# Patient Record
Sex: Male | Born: 1955 | Race: Black or African American | Hispanic: No | Smoking: Never smoker
Health system: Southern US, Community
[De-identification: ages and names within clinical notes are randomized; demographics above are authoritative.]

## PROBLEM LIST (undated history)

## (undated) DIAGNOSIS — E785 Hyperlipidemia, unspecified: Secondary | ICD-10-CM

## (undated) DIAGNOSIS — J45909 Unspecified asthma, uncomplicated: Secondary | ICD-10-CM

## (undated) DIAGNOSIS — I10 Essential (primary) hypertension: Secondary | ICD-10-CM

## (undated) DIAGNOSIS — K219 Gastro-esophageal reflux disease without esophagitis: Secondary | ICD-10-CM

## (undated) DIAGNOSIS — D649 Anemia, unspecified: Secondary | ICD-10-CM

## (undated) DIAGNOSIS — N189 Chronic kidney disease, unspecified: Secondary | ICD-10-CM

## (undated) HISTORY — DX: Hyperlipidemia, unspecified: E78.5

## (undated) HISTORY — PX: CYST REMOVAL TRUNK: SHX6283

## (undated) HISTORY — DX: Essential (primary) hypertension: I10

---

## 2001-05-10 ENCOUNTER — Encounter: Payer: Self-pay | Admitting: *Deleted

## 2001-05-10 ENCOUNTER — Encounter: Admission: RE | Admit: 2001-05-10 | Discharge: 2001-05-10 | Payer: Self-pay | Admitting: *Deleted

## 2002-03-26 ENCOUNTER — Ambulatory Visit: Admission: RE | Admit: 2002-03-26 | Discharge: 2002-03-26 | Payer: Self-pay | Admitting: Nephrology

## 2002-03-29 ENCOUNTER — Observation Stay (HOSPITAL_COMMUNITY): Admission: RE | Admit: 2002-03-29 | Discharge: 2002-03-30 | Payer: Self-pay | Admitting: Nephrology

## 2002-03-29 ENCOUNTER — Encounter: Payer: Self-pay | Admitting: Nephrology

## 2002-05-28 ENCOUNTER — Emergency Department (HOSPITAL_COMMUNITY): Admission: EM | Admit: 2002-05-28 | Discharge: 2002-05-29 | Payer: Self-pay | Admitting: *Deleted

## 2004-08-24 ENCOUNTER — Encounter: Admission: RE | Admit: 2004-08-24 | Discharge: 2004-11-22 | Payer: Self-pay | Admitting: Internal Medicine

## 2006-07-16 ENCOUNTER — Emergency Department (HOSPITAL_COMMUNITY): Admission: EM | Admit: 2006-07-16 | Discharge: 2006-07-16 | Payer: Self-pay | Admitting: Emergency Medicine

## 2010-12-27 ENCOUNTER — Inpatient Hospital Stay (HOSPITAL_COMMUNITY): Payer: PRIVATE HEALTH INSURANCE

## 2010-12-27 ENCOUNTER — Inpatient Hospital Stay (HOSPITAL_COMMUNITY)
Admission: AD | Admit: 2010-12-27 | Discharge: 2010-12-30 | DRG: 641 | Disposition: A | Discharge status: Home / Self Care | Payer: PRIVATE HEALTH INSURANCE | Source: Ambulatory Visit | Attending: Internal Medicine | Admitting: Internal Medicine

## 2010-12-27 DIAGNOSIS — R51 Headache: Secondary | ICD-10-CM | POA: Diagnosis present

## 2010-12-27 DIAGNOSIS — N032 Chronic nephritic syndrome with diffuse membranous glomerulonephritis: Secondary | ICD-10-CM | POA: Diagnosis present

## 2010-12-27 DIAGNOSIS — E785 Hyperlipidemia, unspecified: Secondary | ICD-10-CM | POA: Diagnosis present

## 2010-12-27 DIAGNOSIS — I129 Hypertensive chronic kidney disease with stage 1 through stage 4 chronic kidney disease, or unspecified chronic kidney disease: Secondary | ICD-10-CM | POA: Diagnosis present

## 2010-12-27 DIAGNOSIS — E860 Dehydration: Principal | ICD-10-CM | POA: Diagnosis present

## 2010-12-27 DIAGNOSIS — K5289 Other specified noninfective gastroenteritis and colitis: Secondary | ICD-10-CM | POA: Diagnosis present

## 2010-12-27 DIAGNOSIS — N183 Chronic kidney disease, stage 3 unspecified: Secondary | ICD-10-CM | POA: Diagnosis present

## 2010-12-27 DIAGNOSIS — N179 Acute kidney failure, unspecified: Secondary | ICD-10-CM | POA: Diagnosis present

## 2010-12-27 LAB — COMPREHENSIVE METABOLIC PANEL
ALT: 18 U/L (ref 0–53)
AST: 27 U/L (ref 0–37)
Albumin: 3.3 g/dL — ABNORMAL LOW (ref 3.5–5.2)
Alkaline Phosphatase: 69 U/L (ref 39–117)
BUN: 52 mg/dL — ABNORMAL HIGH (ref 6–23)
CO2: 26 mEq/L (ref 19–32)
Calcium: 9.7 mg/dL (ref 8.4–10.5)
Chloride: 99 mEq/L (ref 96–112)
Creatinine, Ser: 4.02 mg/dL — ABNORMAL HIGH (ref 0.50–1.35)
GFR calc Af Amer: 19 mL/min — ABNORMAL LOW (ref >60)
GFR calc non Af Amer: 16 mL/min — ABNORMAL LOW (ref >60)
Glucose, Bld: 167 mg/dL — ABNORMAL HIGH (ref 70–99)
Potassium: 3.6 mEq/L (ref 3.5–5.1)
Sodium: 137 mEq/L (ref 135–145)
Total Bilirubin: 0.6 mg/dL (ref 0.3–1.2)
Total Protein: 7.7 g/dL (ref 6.0–8.3)

## 2010-12-27 LAB — CBC
HCT: 41.4 % (ref 39.0–52.0)
Hemoglobin: 14.9 g/dL (ref 13.0–17.0)
MCH: 30.5 pg (ref 26.0–34.0)
MCHC: 36 g/dL (ref 30.0–36.0)
MCV: 84.7 fL (ref 78.0–100.0)
Platelets: 138 10*3/uL — ABNORMAL LOW (ref 150–400)
RBC: 4.89 MIL/uL (ref 4.22–5.81)
RDW: 14.5 % (ref 11.5–15.5)
WBC: 20.8 10*3/uL — ABNORMAL HIGH (ref 4.0–10.5)

## 2010-12-27 IMAGING — CT CT ABD-PELV W/O CM
2 of 4 series · 17 of 46 positions shown, 19 images · non-contrast
Comparison: None.

CLINICAL DATA: Abdominal pain with nausea and vomiting since
yesterday.

CT ABDOMEN AND PELVIS WITHOUT CONTRAST
TECHNIQUE: Multidetector CT imaging of the abdomen and pelvis was
performed following the standard protocol without intravenous
contrast.

[Series 2: abd/pelv w/o 5.0 b31f st · axial · non-contrast · 0.83mm/px · z∈[+934,+1410]mm · 14 of 105 slices shown, 16 images]
[im 5/105  soft-tissue]
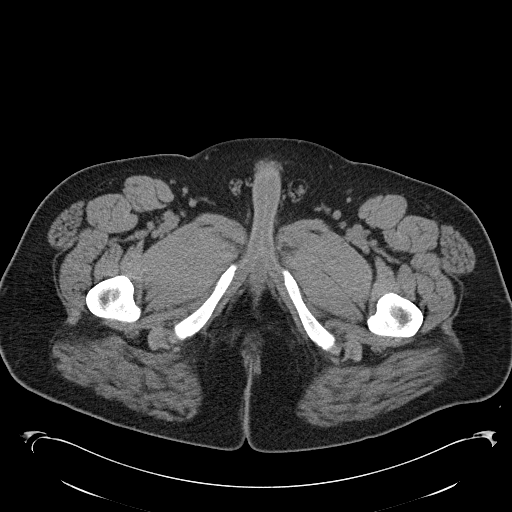
[im 5/105  bone]
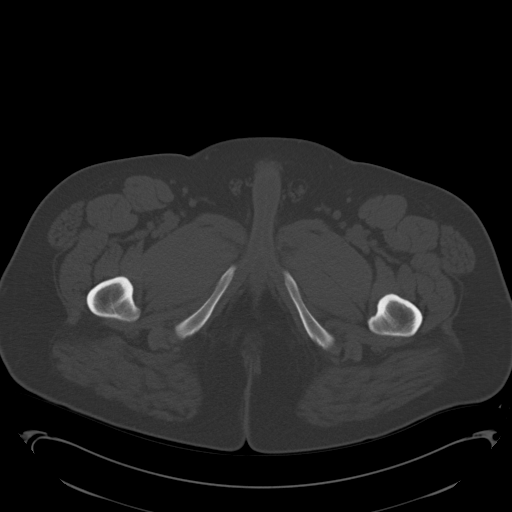
[im 13/105  soft-tissue]
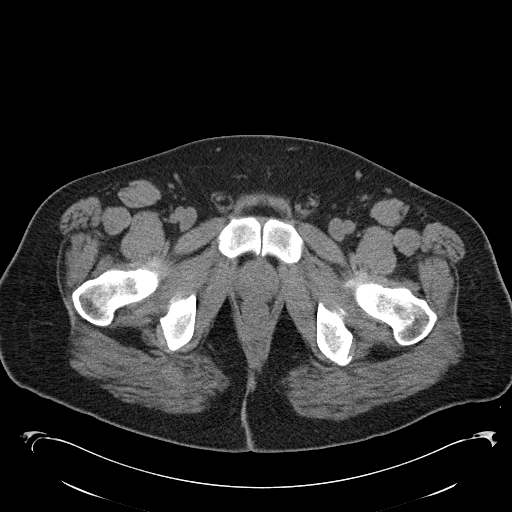
[im 21/105  soft-tissue]
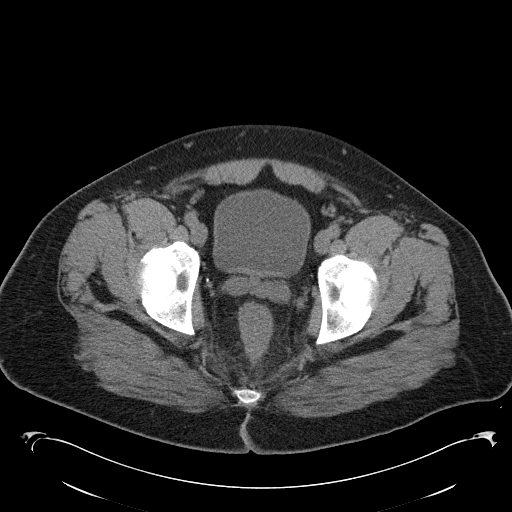
[im 30/105  soft-tissue]
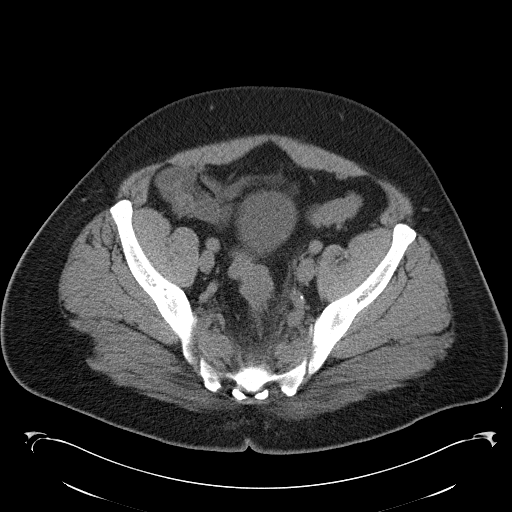
[im 34/105  soft-tissue]
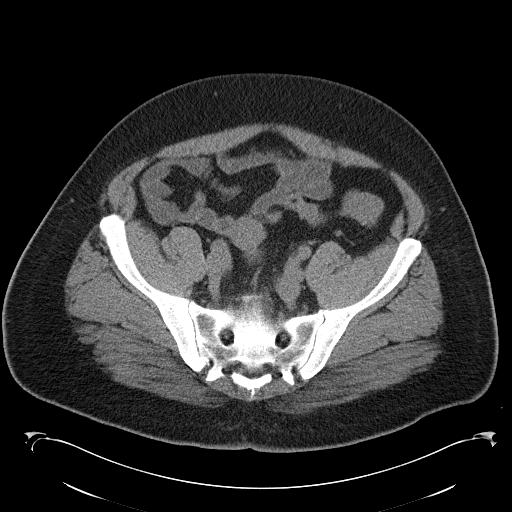
[im 42/105  soft-tissue]
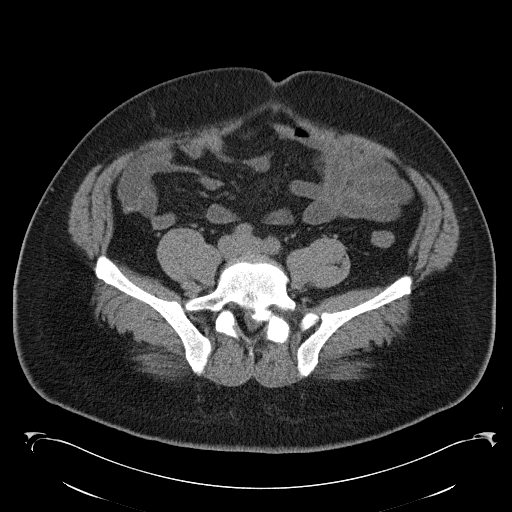
[im 50/105  soft-tissue]
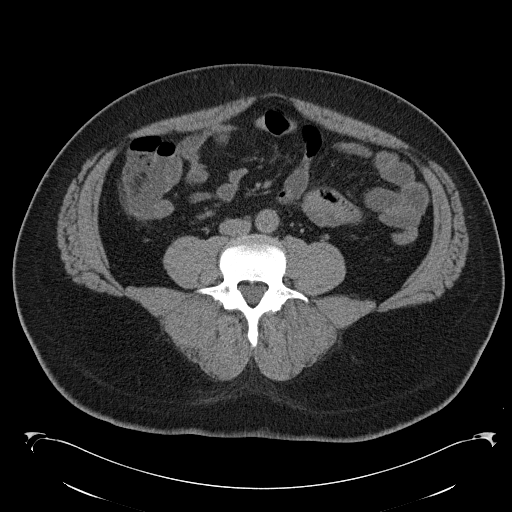
[im 55/105  soft-tissue]
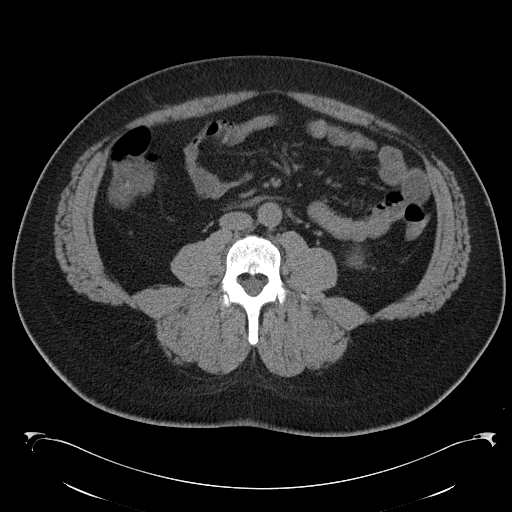
[im 63/105  soft-tissue]
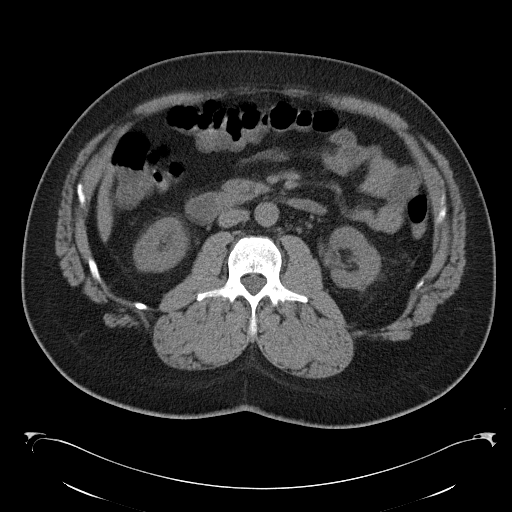
[im 63/105  bone]
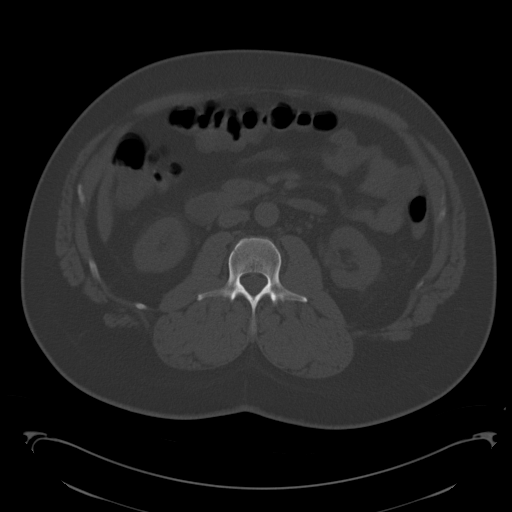
[im 71/105  soft-tissue]
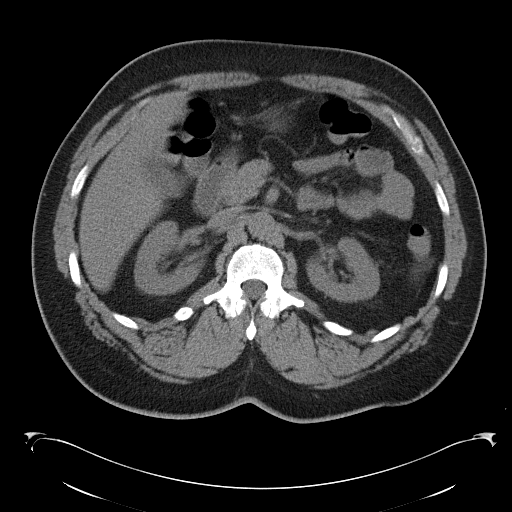
[im 80/105  soft-tissue]
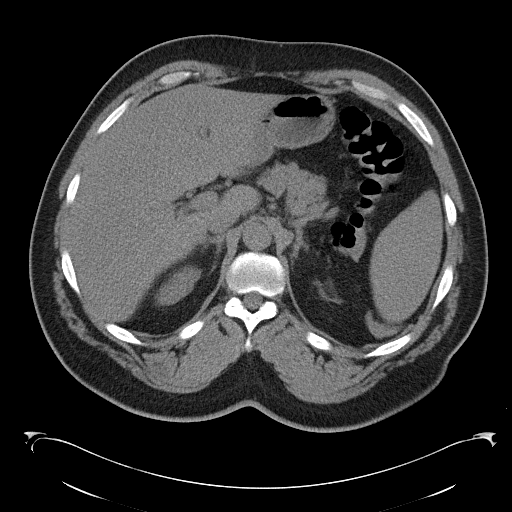
[im 84/105  soft-tissue]
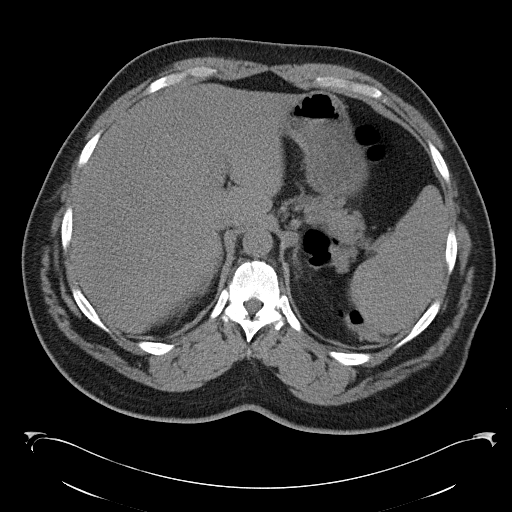
[im 92/105  soft-tissue]
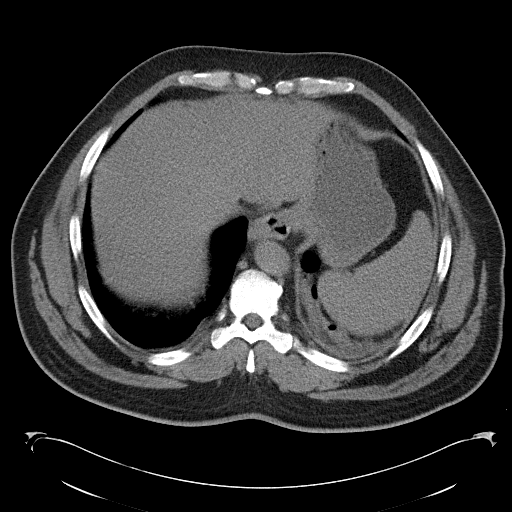
[im 100/105  soft-tissue]
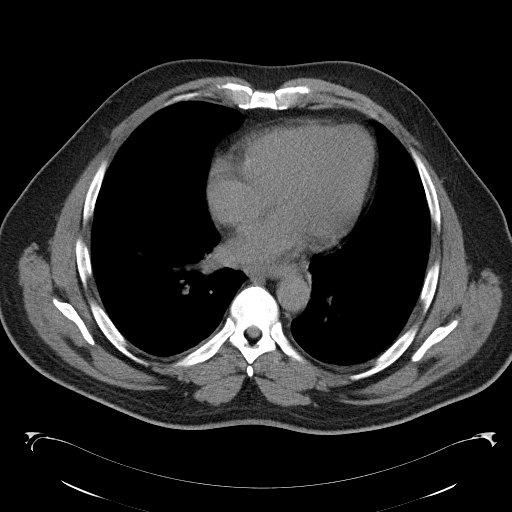

[Series 602: cor · coronal · 1.02mm/px · 3 of 161 slices shown]
[im 54/161  soft-tissue]
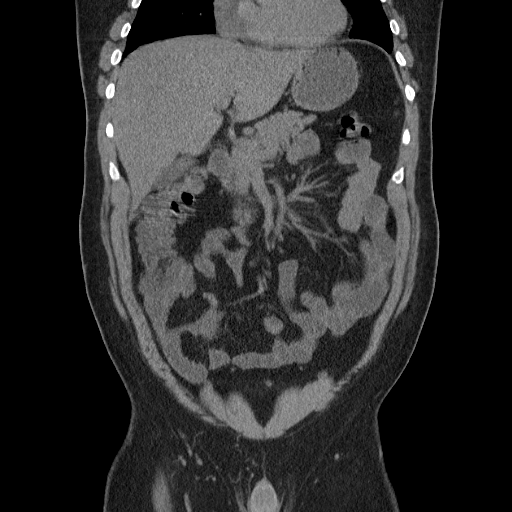
[im 72/161  soft-tissue]
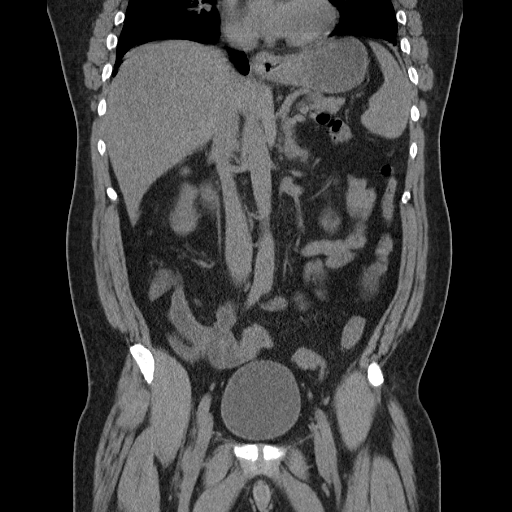
[im 89/161  soft-tissue]
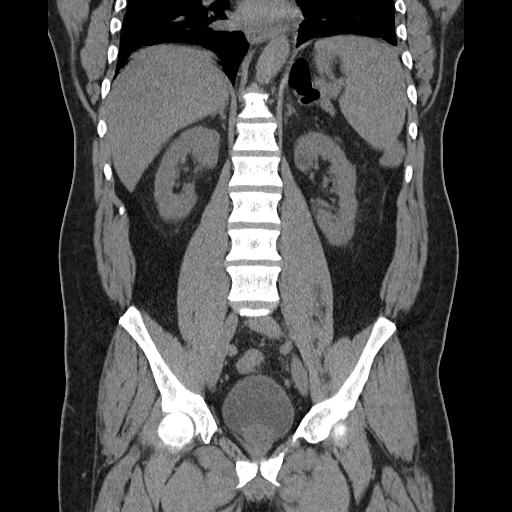

[17 of 46 positions shown; findings below may reference images not displayed]

FINDINGS: There is a small amount of pleural fluid on the left with
mild adjacent left lower lobe atelectasis.  The lung bases are
otherwise clear.  There is a small hiatal hernia.

The liver demonstrates diffusely decreased density consistent with
steatosis.  No focal lesions are identified within the liver,
spleen, gallbladder, pancreas or adrenal glands on noncontrast
imaging.  There are several low density renal lesions bilaterally
which are suboptimally evaluated without contrast, although likely
represent cysts.  There is no hydronephrosis.

The bowel gas pattern is nonobstructive.  There is perirectal soft
tissue stranding bilaterally with possible thickening of the walls
of the rectum, suboptimally evaluated without oral or intravenous
contrast.  No focal extraluminal fluid collection is demonstrated.
There is no evidence of appendiceal inflammation.  There are no
acute osseous findings.
IMPRESSION: 1.  Perirectal soft tissue stranding and possible thickening of the
walls of the rectum suggesting proctitis - correlate clinically.
2.  No evidence of bowel obstruction or abdominal abscess.
3.  Hepatic steatosis.
4.  Low-density renal lesions, likely cysts.

## 2010-12-28 LAB — BASIC METABOLIC PANEL
CO2: 21 mEq/L (ref 19–32)
Chloride: 104 mEq/L (ref 96–112)
GFR calc Af Amer: 21 mL/min — ABNORMAL LOW (ref >60)
Potassium: 3.4 mEq/L — ABNORMAL LOW (ref 3.5–5.1)

## 2010-12-28 LAB — CBC
HCT: 38.9 % — ABNORMAL LOW (ref 39.0–52.0)
Platelets: 144 10*3/uL — ABNORMAL LOW (ref 150–400)
RBC: 4.62 MIL/uL (ref 4.22–5.81)
RDW: 14.6 % (ref 11.5–15.5)
WBC: 14.6 10*3/uL — ABNORMAL HIGH (ref 4.0–10.5)

## 2010-12-29 LAB — COMPREHENSIVE METABOLIC PANEL
Albumin: 2.5 g/dL — ABNORMAL LOW (ref 3.5–5.2)
Alkaline Phosphatase: 78 U/L (ref 39–117)
BUN: 38 mg/dL — ABNORMAL HIGH (ref 6–23)
CO2: 22 mEq/L (ref 19–32)
Chloride: 111 mEq/L (ref 96–112)
GFR calc Af Amer: 27 mL/min — ABNORMAL LOW (ref >60)
GFR calc non Af Amer: 23 mL/min — ABNORMAL LOW (ref >60)
Glucose, Bld: 133 mg/dL — ABNORMAL HIGH (ref 70–99)
Potassium: 3.8 mEq/L (ref 3.5–5.1)
Total Bilirubin: 0.4 mg/dL (ref 0.3–1.2)

## 2010-12-29 LAB — CBC
HCT: 37 % — ABNORMAL LOW (ref 39.0–52.0)
Hemoglobin: 13.3 g/dL (ref 13.0–17.0)
RBC: 4.42 MIL/uL (ref 4.22–5.81)
WBC: 14.5 10*3/uL — ABNORMAL HIGH (ref 4.0–10.5)

## 2010-12-29 LAB — OCCULT BLOOD X 1 CARD TO LAB, STOOL: Fecal Occult Bld: NEGATIVE

## 2010-12-29 LAB — PHOSPHORUS: Phosphorus: 3.2 mg/dL (ref 2.3–4.6)

## 2010-12-29 NOTE — Consult Note (Signed)
[name], Horn NO.:  [id number]  MEDICAL RECORD NO.:  [id number]  LOCATION:  32                         FACILITY:  [facility]  PHYSICIAN:  [name], M.D.   DATE OF BIRTH:  1956-05-24  DATE OF CONSULTATION:  12/28/2010 DATE OF DISCHARGE:                                CONSULTATION   Dr. 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  The patient has no significant prior GI history, having had a negative screening colonoscopy by Dr. Wynetta Emery in 2008.  He had some symptoms of malaise after eating at a wedding reception in a [employer] several days ago, and some general malaise and nausea while out in the summer heat 2 days ago, but his real symptoms began yesterday morning when he began to have significant nausea and vomiting as well as diarrhea.  He presented to the hospital where he was febrile and noted to have a high white count, and he was admitted and started on antibiotics.  A CT scan showed evidence of proctitis as well as some hepatic steatosis, although liver chemistries are normal.  Overnight, his white count has dropped from 21,000-15,000 but he has maintained a low-grade fever.  The patient did not notice any blood with his diarrhea nor does he have tenesmus symptoms or rectal spasms to suggest significant proctitis.  PAST MEDICAL HISTORY:  Allergy to LISINOPRIL (cough).  OUTPATIENT MEDICATIONS:  Micardis, Zocor, vitamin C, Tylenol p.r.n., vitamin D.  OPERATIONS:  None.  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  HABITS:  Nonsmoker and nondrinker.  FAMILY HISTORY:  I believe this is negative for any GI illnesses.  His mother has breast cancer, his father had congestive heart failure  and diabetes.  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  REVIEW OF SYSTEMS:  See HPI.  His last vomiting was yesterday and today he has had only 2 stools which were slightly formed.  PHYSICAL EXAMINATION:  GENERAL:  A stocky African American male in no acute distress. HEENT:  Anicteric.  No pallor. CHEST:  Clear. HEART:  Normal. ABDOMEN:  Normal bowel sounds and no organomegaly, guarding, mass or tenderness appreciated.  LABORATORY DATA:  White count as noted was 20,800 on admission and was 14,600 after overnight hydration, differential count not done, hemoglobin 13.7 following hydration, platelets 144,000.  Chemistry panel pertinent for admission creatinine of 4, which has dropped to 3.7 overnight, BUN 52, potassium 3.4.  Liver chemistries normal.  Albumin prior to hydration was 9.7.  CT as above, raises question of hepatic steatosis and perirectal fat stranding raising the question of proctitis.  IMPRESSION: 1. The abrupt onset of upper and lower gastrointestinal tract symptoms     is most consistent with an infectious process such as a viral     gastroenteritis. 2. The abnormal CT appearance of the rectum is not likely be due to     clinically significant proctitis given the absence of typical  proctitis symptoms as described above. 3. The patient has significant azotemia which is attributed to volume     depletion related to his upper and lower tract symptoms of vomiting     and diarrhea, respectively. 4. The hepatic steatosis is not likely clinically significant since it     is associated with normal liver chemistries. 5. The patient is up-to-date on colon cancer screening.  RECOMMENDATIONS: 1. Advance diet since the patient feels ready to eat, is hungry, and     has been without nausea and vomiting for about 24 hours. 2. Check stool for C diff, although I doubt that he has that although     it can sometimes present as an  enterocolitis with nausea and     vomiting as well as diarrhea.  We will also check stool for occult     blood. 3. The patient was offered sigmoidoscopic evaluation to look at his     rectum in view of the abnormal CT finding, but he prefers to hold     off and I feel that that is okay, especially if his stool comes     back Hemoccult negative and his symptoms resolve nicely. 4. I will give probiotics since the patient is currently on antibiotic     therapy.  This may help reduce diarrhea and the risk of Clostridium     difficile infection.          ______________________________ [name], M.D.     RB/MEDQ  D:  12/28/2010  T:  12/29/2010  Job:  [id number]  cc:   [name], MD Donato Heinz, M.D.  Electronically Signed by [name] M.D. on 12/29/2010 03:04:25 PM

## 2010-12-30 LAB — CBC
HCT: 35.9 % — ABNORMAL LOW (ref 39.0–52.0)
Hemoglobin: 12.9 g/dL — ABNORMAL LOW (ref 13.0–17.0)
MCHC: 35.9 g/dL (ref 30.0–36.0)
WBC: 12.5 10*3/uL — ABNORMAL HIGH (ref 4.0–10.5)

## 2010-12-30 LAB — URINALYSIS, ROUTINE W REFLEX MICROSCOPIC
Bilirubin Urine: NEGATIVE
Specific Gravity, Urine: 1.018 (ref 1.005–1.030)
Urobilinogen, UA: 0.2 mg/dL (ref 0.0–1.0)

## 2010-12-30 LAB — BASIC METABOLIC PANEL
BUN: 30 mg/dL — ABNORMAL HIGH (ref 6–23)
Chloride: 111 mEq/L (ref 96–112)
Glucose, Bld: 112 mg/dL — ABNORMAL HIGH (ref 70–99)
Potassium: 3.8 mEq/L (ref 3.5–5.1)

## 2010-12-30 LAB — URINE MICROSCOPIC-ADD ON

## 2011-01-02 NOTE — H&P (Signed)
NAMEARMEL, SENTMAN                 ACCOUNT NO.:  [id number]  MEDICAL RECORD NO.:  [id number]  LOCATION:  83                         FACILITY:  [facility]  PHYSICIAN:  [name], MD      DATE OF BIRTH:  [birth year]  DATE OF ADMISSION:  12/27/2010 DATE OF DISCHARGE:                             HISTORY & PHYSICAL   CHIEF COMPLAINT:  Vomiting, abdominal pain, diarrhea, and unable to keep any p.o. intake.  HISTORY OF PRESENT ILLNESS:  A 55 year old male with history of chronic kidney disease secondary to focal glomerulonephritis for many years, hypertension, and dyslipidemia, was in the usual state of health until Saturday.  Last about 2 days ago, started having acute episode of headache and GI symptoms with vomiting and diarrhea and abdominal discomfort.  He was unable to keep any p.o. intake down except for a few crackers.  Symptoms last 24 hours got worse.  He feels weak, continues to have a headache.  Also, started having some abdominal discomfort with possible distention of the abdomen.  He was here with his mother in the office feeling ill and complaining of headache.  The patient denies any chest congestion.  No cough, no shortness of breath.  No new medication, no rash, no joint pains.  PAST MEDICAL HISTORY: 1. Chronic kidney disease secondary to focal glomerulonephritis,     baseline creatinine 1.9, followed by Nephrology, renal biopsy in     2003. 2. Hypertension. 3. Dyslipidemia. 4. History of asthma in childhood, no problem. 5. History of erectile dysfunction. 6. History of allergies.  CURRENT MEDICATIONS: 1. Micardis 80 mg daily. 2. Zocor 20 mg daily. 3. Vitamin C one daily. 4. Vitamin D one tablet daily. 5. Tylenol p.r.n.  PAST SURGICAL HISTORY:  Renal biopsy.  FAMILY HISTORY:  Significant for father died of heart failure.  Mother living with pacemaker and history of breast cancer.  No other siblings.  SOCIAL HISTORY:  No tobacco or alcohol.  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  He is single, divorced. Two children.  ALLERGIES:  To the medication LISINOPRIL causing cough.  PHYSICAL EXAMINATION:  VITAL SIGNS:  On exam today, his blood pressure was 124/70, temperature 99, pulse 70, weight of 260 pounds, height of 74 inches. GENERAL:  Ill appearing mildly with mild distress, lying in the exam table. NECK:  Supple. LUNGS:  Clear. CARDIAC:  S1, S2 without any murmurs. ABDOMEN:  Soft.  Tenderness, diffuse on palpation, without any rebound or guarding.  Decreased bowel sounds. EXTREMITIES:  No edema.  X-ray of abdomen showed possible air-fluid levels.  Gastroenteritis versus early small bowel obstruction.  IMPRESSION:  A 55 year old male with abdominal pain, nausea, vomiting, diarrhea, chronic kidney disease, and hypertension.  Possible dehydration.  PLAN:  Admit to the hospital.  IV fluids.  Hold his blood pressure medication.  CT of the abdomen and pelvic, noncontrast.  IV Protonix and follow up on blood work.  We will get blood chemistries, blood counts, urinalysis, and blood cultures.  No need for antibiotics at this point. Once we get the CT report, we will go further with treatment management. The patient was sent  to the hospital from the office and the plan was discussed with him as well as his mother who was with him to take the patient.     [name], MD     KH/MEDQ  D:  12/27/2010  T:  12/27/2010  Job:  [id number]  Electronically Signed by [name] MD on 01/02/2011 06:35:44 PM

## 2011-01-02 NOTE — Discharge Summary (Signed)
[name], [name]                 ACCOUNT NO.:  [id number]  MEDICAL RECORD NO.:  [id number]  LOCATION:  36                         FACILITY:  [facility]  PHYSICIAN:  [name], MD      DATE OF BIRTH:  19-Jun-1955  DATE OF ADMISSION:  12/27/2010 DATE OF DISCHARGE:  12/30/2010                              DISCHARGE SUMMARY   DISCHARGE DIAGNOSES: 1. Acute nausea, vomiting, diarrhea, dehydration. 2. Gastroenteritis, thought be proctocolitis/enteritis. 3. Acute-on-chronic renal failure due to dehydration. 4. History of hypertension. 5. History of dyslipidemia. 6. History of focal glomerulonephritis with baseline creatinine of 2.  MEDICATION ON DISCHARGE: 1. Cipro 250 mg b.i.d. for 7 days. 2. Flagyl 500 mg b.i.d. for 7 days. 3. Vitamin D 1000 units daily. 4. Zocor 20 mg daily. 5. Micardis 80 mg on hold until the follow appointment at the office.  DIET:  Bland diet, advance as tolerated.  CONSULTATION: 1. GI, Dr. Herbie Baltimore Buccini. 2. Renal, Dr. Windy Kalata.  LABORATORY DATA:  White count of 20,000 on admission, decreased down to 12.5, platelet count was 138 at the time of discharge was 212.  Blood cultures were negative.  C. diff negative.  Blood chemistries, creatinine at the time was 4.2 with BUN of 52; at the time of discharge BUN of 30, creatinine 2.4, sodium 144, potassium 3.8.  LFTs were normal. UA was negative except for mild proteins.  Hemoccult negative. Abdominal plain x-rays in the office were negative for obstruction.  CT scan of the abdomen and pelvic was done without contrast showed evidence of inflamed area on the rectum possible segmental enteritis versus proctocolitis.  HOSPITAL COURSE:  The patient is a 55 year old male with history of chronic kidney disease secondary to focal segmental glomerulonephritis, creatinine baseline of 2, hypertension, came into the office with acute episode of for nausea, vomiting, diarrhea with dehydration, unable  to keep any p.o. intake, abdominal pain, and some distention. 1. GI:  The patient had a plain film x-ray did not show any     obstruction.  He had underwent an abdominal pelvic CT without     contrast which showed possible proctocolitis versus enteritis with     the above finding.  The patient was admitted to the hospital and     was started on IV fluids.  His creatinine was up to 4 from the     baseline of 2 and his white count was 20,000.  He had a fever of     101.  The patient had blood cultures and was started on antibiotic     Cipro and Flagyl to cover the colon.  Differential diagnosis was     acute gastroenteritis versus proctocolitis.  The patient will     continue with the antibiotics p.o. Cipro and Flagyl at home for     total of 7 days.  GI was consulted, did not require any flex-sig,     thought to be possible most likely gastroenteritis. 2. Acute-on-chronic renal failure, was seen by his Renal doctors,     suggested a prerenal azotemia.  Continue IV fluids with improvement     in the renal function, creatinine dropped down  to 2.5 close to     baseline with the IV fluids.  No other intervention was needed.  UA     was negative for infection. 3. Hypertension.  _________ dehydration.  Micardis was put on hold.     He will stay off the Micardis until I see him next week at the     office.  The patient did well with hydration and IV antibiotics,     improved condition, was discharged at home, will follow up in 1     week.  discharge planning time taken was more than 30 minutes.  The patient will follow up with the office in 1 week with me, also follow up with Renal doctors as scheduled.     [name], MD     KH/MEDQ  D:  12/30/2010  T:  12/30/2010  Job:  [id number]  cc:   Donato Heinz, M.D.  Electronically Signed by [name] MD on 01/02/2011 06:35:47 PM

## 2011-01-03 LAB — CULTURE, BLOOD (ROUTINE X 2)
Culture  Setup Time: 201207310134
Culture: NO GROWTH

## 2011-01-26 NOTE — Consult Note (Signed)
[name], [name]  MEDICAL RECORD NO.:  [id number]  LOCATION:  64                         FACILITY:  [facility]  PHYSICIAN:  Windy Kalata, M.D.DATE OF BIRTH:  Oct 02, 1955  DATE OF CONSULTATION:  12/28/2010 DATE OF DISCHARGE:                                CONSULTATION   PRIMARY CARE [name]:  [name], MD with [facility].  CHIEF COMPLAINT:  Nausea, vomiting and diarrhea.  REASON FOR CONSULTATION:  Acute kidney injury.  HISTORY OF PRESENT ILLNESS:  This is a 55 year old gentleman with history of focal segmental glomerulosclerosis presenting with the above chief complaint.  On Saturday, the patient started feeling unwell with a headache.  The following day on Sunday, December 26, 2010, he spent the day outside at a water park.  He had an episode of vomiting, but that resolved with a cold beverage.  Following day yesterday, December 27, 2010, the patient woke up feeling nauseated and had several episodes of vomiting and a small episode of diarrhea.  He denied blood in vomitus or stool.  He denies any sick contacts.  The patient did attend wedding on Saturday, December 25, 2010, but does not know if other people were sick. They had finger foods and that was in the indoors.  Yesterday, the patient called his primary care [name] after having several episodes of the vomiting and the diarrhea.  He was found to have evidence of proctitis on CT of abdomen and pelvis.  The patient reports last taking his home medications including his Micardis/hydrochlorothiazide yesterday morning.  He now presents with acute kidney injury.  The patient is followed by nephrologist, Dr. [name] who saw him last a few months ago.  The patient, however, did have a lab work done on December 06, 2010.  His electrolytes were normal and his creatinine was 2.05 at that time.  The patient has a history of focal segmental glomerulosclerosis that was diagnosed about 9 years ago.   He was found to have positive protein in the urinalysis done when applying for health insurance.  He has never been on steroids.  His FSGS is controlled with blood pressure control.  ALLERGIES:  None.  HOME MEDICATIONS: 1. Zocor 20 mg p.o. nightly. 2. Tylenol p.r.n. 3. Coricidin HBP p.r.n. for allergies. 4. One-A-Day Men's daily. 5. Micardis/hydrochlorothiazide 80/12.5 mg p.o. daily. 6. Extra Strength Tylenol p.r.n.  CURRENT MEDICATIONS: 1. Ciprofloxacin 400 mg IV daily. 2. D5 half normal saline at 125 mL per hour. 3. Lovenox 30 units subcutaneously daily. 4. Flagyl 500 mg IV q.8 h. 5. Protonix. 6. Tylenol 650 mg p.r.n. 7. Zofran p.r.n. 8. Phenergan p.r.n. 9. Ambien p.r.n.  PAST MEDICAL HISTORY: 1. Chronic kidney disease, stage II secondary to focal segmental     glomerulosclerosis with baseline creatinine around 2.0. 2. Hypertension. 3. History of childhood asthma.  PAST SURGICAL HISTORY: 1. Pilonidal cyst removal in the 1970s. 2. Renal biopsy.  SOCIAL HISTORY:  The patient lives alone in [place], [place].  He was visiting his mother this weekend in [place].  He works as a Theme park manager at [employer].  Denies any history of tobacco, alcohol, or drug use.  FAMILY  HISTORY:  Mother has a history of breast cancer and a pacemaker. She is otherwise in good health.  Father died from heart failure, siblings with asthma.  REVIEW OF SYSTEMS:  Positive for fever, fatigue, cough and abdominal pain.  Negative for chills, sweats, weight changes, chest pain, dyspnea, wheezing, dysuria, hematuria, nocturia, hesitancy, hematemesis, incontinence, bright red blood per rectum, melena, rash and dizziness.  PHYSICAL EXAMINATION:  VITAL SIGNS:  Temperature 100.0, pulse 90s to 110s, respiratory rate 18, blood pressure 101/57, PO2 97% on room air. In's and out's of urine output x3. GENERAL:  Not apparent distress. HEENT:  Moderately dry mucous membranes. CARDIOVASCULAR:   Regular rate and rhythm with no murmurs, rubs, or gallops. LUNGS:  Clear to auscultation bilaterally with no wheezes, rales, or rhonchi. ABDOMEN:  Obese, normoactive bowel sounds, soft, nontender throughout. BACK:  No tenderness to palpation. GU:  No Foley. EXTREMITIES:  No pitting or pedal edema bilaterally. NEUROLOGIC:  Foley intact. SKIN:  A 3-4 seconds capillary refill.  LABS AND STUDIES:  White blood count 14.6, hemoglobin 13.7.  Potassium 3.4, bicarb 21, creatinine 3.73.  Creatinine yesterday was 4.02.  CT of abdomen and pelvis shows; 1. Perirectal soft tissue stranding and possible thickening of the     wall of the rectum suggestive of proctitis. 2. Low density renal lesions bilaterally suggestive of better likely     renal cysts.  ASSESSMENT AND PLAN:  This is a 55 year old African American male with a history of stage III chronic kidney disease secondary to focal segmental glomerulosclerosis who was admitted with nausea, vomiting, and diarrhea x2 days.  The patient was still taking his Micardis/hydrochlorothiazide. His baseline creatinine seems to be at 2.0, it was 2.05 earlier this month.  It was 4.0 on admission and then it is now 3.0.  It has improved with fluids.  We were not able to examine his urine as he was not taking it.  We suspect acute on chronic kidney disease secondary to volume depletion, angiotensin receptor blocker, and diuretic.  He also has signs of proctitis with systemic evidence of proctitis.  Blood cultures are currently pending.  He has been on ciprofloxacin and Flagyl for less than 24 hours and is still having elevated temperatures.  His decreasing white count is reassuring, however.  Regarding his renal issues, we will increase fluid to 250 mL per hour x1 liter and see still appears to be dehydrated with low blood pressures and physical exam findings.  We will check strict ins and outs to assess urine output.  We will check urinalysis, which has  been ordered.  We will continue to hold his Micardis/hydrochlorothiazide for now.  We will follow up his renal function panel in the a.m.  The patient is currently on a clear liquid diet.    ______________________________ Karen Kays, MD   ______________________________ Windy Kalata, M.D.    AO/MEDQ  D:  12/28/2010  T:  12/29/2010  Job:  [id number]  Electronically Signed by Karen Kays MD on 01/19/2011 11:52:08 AM Electronically Signed by Fleet Contras M.D. on 01/26/2011 06:55:42 PM

## 2011-05-23 ENCOUNTER — Encounter: Payer: PRIVATE HEALTH INSURANCE | Admitting: Cardiology

## 2015-10-03 LAB — BASIC METABOLIC PANEL
GLUCOSE: 92 mg/dL
GLUCOSE: 92 mg/dL

## 2019-06-13 ENCOUNTER — Other Ambulatory Visit: Payer: Self-pay | Admitting: Nephrology

## 2019-06-13 DIAGNOSIS — N179 Acute kidney failure, unspecified: Secondary | ICD-10-CM

## 2019-06-18 ENCOUNTER — Ambulatory Visit
Admission: RE | Admit: 2019-06-18 | Discharge: 2019-06-18 | Disposition: A | Discharge status: Home / Self Care | Payer: PRIVATE HEALTH INSURANCE | Source: Ambulatory Visit | Attending: Nephrology | Admitting: Nephrology

## 2019-06-18 DIAGNOSIS — N179 Acute kidney failure, unspecified: Secondary | ICD-10-CM

## 2019-06-21 ENCOUNTER — Other Ambulatory Visit (HOSPITAL_COMMUNITY): Payer: Self-pay | Admitting: Nephrology

## 2019-06-21 DIAGNOSIS — I10 Essential (primary) hypertension: Secondary | ICD-10-CM

## 2019-06-21 DIAGNOSIS — R809 Proteinuria, unspecified: Secondary | ICD-10-CM

## 2019-06-22 ENCOUNTER — Other Ambulatory Visit: Payer: Self-pay | Admitting: Internal Medicine

## 2019-06-22 ENCOUNTER — Other Ambulatory Visit: Payer: Self-pay | Admitting: Nephrology

## 2019-06-28 ENCOUNTER — Other Ambulatory Visit: Payer: Self-pay | Admitting: Student

## 2019-07-01 ENCOUNTER — Ambulatory Visit (HOSPITAL_COMMUNITY)
Admission: RE | Admit: 2019-07-01 | Discharge: 2019-07-01 | Disposition: A | Discharge status: Home / Self Care | Payer: 59 | Source: Ambulatory Visit | Attending: Nephrology | Admitting: Nephrology

## 2019-07-01 ENCOUNTER — Other Ambulatory Visit: Payer: Self-pay

## 2019-07-01 ENCOUNTER — Encounter (HOSPITAL_COMMUNITY): Payer: Self-pay

## 2019-07-01 ENCOUNTER — Other Ambulatory Visit (HOSPITAL_COMMUNITY): Payer: Self-pay | Admitting: Nephrology

## 2019-07-01 DIAGNOSIS — R809 Proteinuria, unspecified: Secondary | ICD-10-CM

## 2019-07-01 DIAGNOSIS — I10 Essential (primary) hypertension: Secondary | ICD-10-CM

## 2019-07-01 DIAGNOSIS — Z79899 Other long term (current) drug therapy: Secondary | ICD-10-CM | POA: Diagnosis not present

## 2019-07-01 DIAGNOSIS — N189 Chronic kidney disease, unspecified: Secondary | ICD-10-CM | POA: Diagnosis not present

## 2019-07-01 DIAGNOSIS — I129 Hypertensive chronic kidney disease with stage 1 through stage 4 chronic kidney disease, or unspecified chronic kidney disease: Secondary | ICD-10-CM | POA: Insufficient documentation

## 2019-07-01 DIAGNOSIS — Z8249 Family history of ischemic heart disease and other diseases of the circulatory system: Secondary | ICD-10-CM | POA: Diagnosis not present

## 2019-07-01 DIAGNOSIS — Z7952 Long term (current) use of systemic steroids: Secondary | ICD-10-CM | POA: Insufficient documentation

## 2019-07-01 HISTORY — DX: Chronic kidney disease, unspecified: N18.9

## 2019-07-01 LAB — PROTIME-INR
INR: 1.1 (ref 0.8–1.2)
Prothrombin Time: 13.6 seconds (ref 11.4–15.2)

## 2019-07-01 LAB — CBC
HCT: 37.4 % — ABNORMAL LOW (ref 39.0–52.0)
Hemoglobin: 12.6 g/dL — ABNORMAL LOW (ref 13.0–17.0)
MCH: 29.2 pg (ref 26.0–34.0)
MCHC: 33.7 g/dL (ref 30.0–36.0)
MCV: 86.8 fL (ref 80.0–100.0)
Platelets: 330 10*3/uL (ref 150–400)
RBC: 4.31 MIL/uL (ref 4.22–5.81)
RDW: 14.8 % (ref 11.5–15.5)
WBC: 17.9 10*3/uL — ABNORMAL HIGH (ref 4.0–10.5)
nRBC: 0 % (ref 0.0–0.2)

## 2019-07-01 MED ORDER — HYDRALAZINE HCL 20 MG/ML IJ SOLN
INTRAMUSCULAR | Status: AC | PRN
  Administered 2019-07-01: 10 mg via INTRAVENOUS

## 2019-07-01 MED ORDER — SODIUM CHLORIDE 0.9 % IV SOLN: INTRAVENOUS | Status: DC

## 2019-07-01 MED ORDER — FENTANYL CITRATE (PF) 100 MCG/2ML IJ SOLN
INTRAMUSCULAR | Status: AC | PRN
  Administered 2019-07-01: 50 ug via INTRAVENOUS
  Administered 2019-07-01: 25 ug via INTRAVENOUS

## 2019-07-01 MED ORDER — LIDOCAINE HCL (PF) 1 % IJ SOLN
INTRAMUSCULAR | Status: AC
  Filled 2019-07-01: qty 30

## 2019-07-01 MED ORDER — FENTANYL CITRATE (PF) 100 MCG/2ML IJ SOLN
INTRAMUSCULAR | Status: AC
  Filled 2019-07-01: qty 2

## 2019-07-01 MED ORDER — GELATIN ABSORBABLE 12-7 MM EX MISC
CUTANEOUS | Status: AC
  Filled 2019-07-01: qty 1

## 2019-07-01 MED ORDER — MIDAZOLAM HCL 2 MG/2ML IJ SOLN
INTRAMUSCULAR | Status: AC | PRN
  Administered 2019-07-01: 2 mg via INTRAVENOUS

## 2019-07-01 MED ORDER — MIDAZOLAM HCL 2 MG/2ML IJ SOLN
INTRAMUSCULAR | Status: AC
  Filled 2019-07-01: qty 2

## 2019-07-01 MED ORDER — HYDRALAZINE HCL 20 MG/ML IJ SOLN
INTRAMUSCULAR | Status: AC
  Filled 2019-07-01: qty 1

## 2019-07-01 MED ORDER — HYDROCODONE-ACETAMINOPHEN 5-325 MG PO TABS: 1.0000 | ORAL_TABLET | ORAL | Status: DC | PRN

## 2019-07-01 NOTE — Discharge Instructions (Signed)
Percutaneous Kidney Biopsy, Care After This sheet gives you information about how to care for yourself after your procedure. Your health care provider may also give you more specific instructions. If you have problems or questions, contact your health care provider. What can I expect after the procedure? After the procedure, it is common to have:  Pain or soreness near the biopsy site.  Pink or cloudy urine for 24 hours after the procedure. Follow these instructions at home: Activity  Return to your normal activities as told by your health care provider. Ask your health care provider what activities are safe for you.  If you were given a sedative during the procedure, it can affect you for several hours. Do not drive or operate machinery until your health care provider says that it is safe.  Do not lift anything that is heavier than 10 lb (4.5 kg), or the limit that you are told, until your health care provider says that it is safe.  Avoid activities that take a lot of effort (are strenuous) until your health care provider approves. Most people will have to wait 2 weeks before returning to activities such as exercise or sex. General instructions   Take over-the-counter and prescription medicines only as told by your health care provider.  You may eat and drink after your procedure. Follow instructions from your health care provider about eating or drinking restrictions.  Check your biopsy site every day for signs of infection. Check for: ? More redness, swelling, or pain. ? Fluid or blood. ? Warmth. ? Pus or a bad smell.  Keep all follow-up visits as told by your health care provider. This is important. Contact a health care provider if:  You have more redness, swelling, or pain around your biopsy site.  You have fluid or blood coming from your biopsy site.  Your biopsy site feels warm to the touch.  You have pus or a bad smell coming from your biopsy site.  You have blood  in your urine more than 24 hours after your procedure.  You have a fever. Get help right away if:  Your urine is dark red or brown.  You cannot urinate.  It burns when you urinate.  You feel dizzy or light-headed.  You have severe pain in your abdomen or side. Summary  After the procedure, it is common to have pain or soreness at the biopsy site and pink or cloudy urine for the first 24 hours.  Check your biopsy site each day for signs of infection, such as more redness, swelling, or pain; fluid, blood, pus or a bad smell coming from the biopsy site; or the biopsy site feeling warm to touch.  Return to your normal activities as told by your health care provider. This information is not intended to replace advice given to you by your health care provider. Make sure you discuss any questions you have with your health care provider. Document Revised: 01/17/2019 Document Reviewed: 01/17/2019 Elsevier Patient Education  2020 [employer]  

## 2019-07-01 NOTE — Procedures (Signed)
Interventional Radiology Procedure Note  Procedure: Technically successful LEFT random renal biopsy.  Complications: None  Estimated Blood Loss: None  Recommendations: - Bedrest x 6 hrs - Clears x 2 hrs then ADAT to regular   Signed,  Criselda Peaches, MD

## 2019-07-01 NOTE — Discharge Instructions (Signed)
Percutaneous Kidney Biopsy, Care After This sheet gives you information about how to care for yourself after your procedure. Your health care provider may also give you more specific instructions. If you have problems or questions, contact your health care provider. What can I expect after the procedure? After the procedure, it is common to have:  Pain or soreness near the biopsy site.  Pink or cloudy urine for 24 hours after the procedure. Follow these instructions at home: Activity  Return to your normal activities as told by your health care provider. Ask your health care provider what activities are safe for you.  If you were given a sedative during the procedure, it can affect you for several hours. Do not drive or operate machinery until your health care provider says that it is safe.  Do not lift anything that is heavier than 10 lb (4.5 kg), or the limit that you are told, until your health care provider says that it is safe.  Avoid activities that take a lot of effort (are strenuous) until your health care provider approves. Most people will have to wait 2 weeks before returning to activities such as exercise or sex. General instructions   Take over-the-counter and prescription medicines only as told by your health care provider.  You may eat and drink after your procedure. Follow instructions from your health care provider about eating or drinking restrictions.  Check your biopsy site every day for signs of infection. Check for: ? More redness, swelling, or pain. ? Fluid or blood. ? Warmth. ? Pus or a bad smell.  Keep all follow-up visits as told by your health care provider. This is important. Contact a health care provider if:  You have more redness, swelling, or pain around your biopsy site.  You have fluid or blood coming from your biopsy site.  Your biopsy site feels warm to the touch.  You have pus or a bad smell coming from your biopsy site.  You have blood  in your urine more than 24 hours after your procedure.  You have a fever. Get help right away if:  Your urine is dark red or brown.  You cannot urinate.  It burns when you urinate.  You feel dizzy or light-headed.  You have severe pain in your abdomen or side. Summary  After the procedure, it is common to have pain or soreness at the biopsy site and pink or cloudy urine for the first 24 hours.  Check your biopsy site each day for signs of infection, such as more redness, swelling, or pain; fluid, blood, pus or a bad smell coming from the biopsy site; or the biopsy site feeling warm to touch.  Return to your normal activities as told by your health care provider. This information is not intended to replace advice given to you by your health care provider. Make sure you discuss any questions you have with your health care provider. Document Revised: 01/17/2019 Document Reviewed: 01/17/2019 Elsevier Patient Education  2020 Elsevier Inc. Moderate Conscious Sedation, Adult Sedation is the use of medicines to promote relaxation and relieve discomfort and anxiety. Moderate conscious sedation is a type of sedation. Under moderate conscious sedation, you are less alert than normal, but you are still able to respond to instructions, touch, or both. Moderate conscious sedation is used during short medical and dental procedures. It is milder than deep sedation, which is a type of sedation under which you cannot be easily woken up. It is also milder than   general anesthesia, which is the use of medicines to make you unconscious. Moderate conscious sedation allows you to return to your regular activities sooner. Tell a health care provider about:  Any allergies you have.  All medicines you are taking, including vitamins, herbs, eye drops, creams, and over-the-counter medicines.  Use of steroids (by mouth or creams).  Any problems you or family members have had with sedatives and anesthetic  medicines.  Any blood disorders you have.  Any surgeries you have had.  Any medical conditions you have, such as sleep apnea.  Whether you are pregnant or may be pregnant.  Any use of cigarettes, alcohol, marijuana, or street drugs. What are the risks? Generally, this is a safe procedure. However, problems may occur, including:  Getting too much medicine (oversedation).  Nausea.  Allergic reaction to medicines.  Trouble breathing. If this happens, a breathing tube may be used to help with breathing. It will be removed when you are awake and breathing on your own.  Heart trouble.  Lung trouble. What happens before the procedure? Staying hydrated Follow instructions from your health care provider about hydration, which may include:  Up to 2 hours before the procedure - you may continue to drink clear liquids, such as water, clear fruit juice, black coffee, and plain tea. Eating and drinking restrictions Follow instructions from your health care provider about eating and drinking, which may include:  8 hours before the procedure - stop eating heavy meals or foods such as meat, fried foods, or fatty foods.  6 hours before the procedure - stop eating light meals or foods, such as toast or cereal.  6 hours before the procedure - stop drinking milk or drinks that contain milk.  2 hours before the procedure - stop drinking clear liquids. Medicine Ask your health care provider about:  Changing or stopping your regular medicines. This is especially important if you are taking diabetes medicines or blood thinners.  Taking medicines such as aspirin and ibuprofen. These medicines can thin your blood. Do not take these medicines before your procedure if your health care provider instructs you not to.  Tests and exams  You will have a physical exam.  You may have blood tests done to show: ? How well your kidneys and liver are working. ? How well your blood can clot. General  instructions  Plan to have someone take you home from the hospital or clinic.  If you will be going home right after the procedure, plan to have someone with you for 24 hours. What happens during the procedure?  An IV tube will be inserted into one of your veins.  Medicine to help you relax (sedative) will be given through the IV tube.  The medical or dental procedure will be performed. What happens after the procedure?  Your blood pressure, heart rate, breathing rate, and blood oxygen level will be monitored often until the medicines you were given have worn off.  Do not drive for 24 hours. This information is not intended to replace advice given to you by your health care provider. Make sure you discuss any questions you have with your health care provider. Document Revised: 04/28/2017 Document Reviewed: 09/05/2015 Elsevier Patient Education  2020 Elsevier Inc.  

## 2019-07-01 NOTE — Consult Note (Signed)
Chief Complaint: Patient was seen in consultation today for image guided random renal biopsy  Referring Physician(s): Coladonato,Joseph  Supervising Physician: Jacqulynn Cadet  Patient Status: [facility] - Out-pt  History of Present Illness: [name] is a 64 y.o. male with hx HTN, proteinuria and acute kidney injury on chronic kidney disease who presents today for image guided random renal biopsy for further evaluation.   Past Medical History:  Diagnosis Date  . Chronic kidney disease     Past Surgical History:  Procedure Laterality Date  . NO PAST SURGERIES      Allergies: Patient has no known allergies.  Medications: Prior to Admission medications   Medication Sig Start Date End Date Taking? Authorizing Provider  allopurinol (ZYLOPRIM) 100 MG tablet Take 100 mg by mouth daily.   Yes [provider]  amLODipine (NORVASC) 5 MG tablet Take 5 mg by mouth daily.   Yes [provider]  esomeprazole (NEXIUM) 20 MG capsule Take 20 mg by mouth daily at 12 noon.   Yes [provider]  furosemide (LASIX) 20 MG tablet Take 20 mg by mouth.   Yes [provider]  predniSONE (DELTASONE) 20 MG tablet Take 20 mg by mouth daily with breakfast.   Yes [provider]  simvastatin (ZOCOR) 20 MG tablet Take 20 mg by mouth daily.   Yes [provider]     Family History  Problem Relation Age of Onset  . Heart disease Father     Social History   Socioeconomic History  . Marital status: Divorced    Spouse name: Not on file  . Number of children: Not on file  . Years of education: Not on file  . Highest education level: Not on file  Occupational History  . Not on file  Tobacco Use  . Smoking status: Never Smoker  . Smokeless tobacco: Never Used  Substance and Sexual Activity  . Alcohol use: Never  . Drug use: Never  . Sexual activity: Not on file  Other Topics Concern  . Not on file  Social History Narrative  . Not on  file   Social Determinants of Health   Financial Resource Strain:   . Difficulty of Paying Living Expenses: Not on file  Food Insecurity:   . Worried About Charity fundraiser in the Last Year: Not on file  . Ran Out of Food in the Last Year: Not on file  Transportation Needs:   . Lack of Transportation (Medical): Not on file  . Lack of Transportation (Non-Medical): Not on file  Physical Activity:   . Days of Exercise per Week: Not on file  . Minutes of Exercise per Session: Not on file  Stress:   . Feeling of Stress : Not on file  Social Connections:   . Frequency of Communication with Friends and Family: Not on file  . Frequency of Social Gatherings with Friends and Family: Not on file  . Attends Religious Services: Not on file  . Active Member of Clubs or Organizations: Not on file  . Attends Archivist Meetings: Not on file  . Marital Status: Not on file      Review of Systems denies fever, HA, chest pain, dyspnea, cough, abd pain, N/V or bleeding. He does have some intermittent back pain.   Vital Signs: BP (!) 185/89   Pulse 100   Temp 98 F (36.7 C) (Oral)   Resp 16   Ht 6\' 6"  (1.981 m)  Wt 240 lb (108.9 kg)   SpO2 98%   BMI 27.73 kg/m   Physical Exam awake/alert; chest- CTA bilat; heart- RRR; abd- soft,+BS,NT; no LE edema  Imaging: US RENAL  Result Date: 06/18/2019 CLINICAL DATA:  Acute kidney injury EXAM: RENAL / URINARY TRACT ULTRASOUND COMPLETE COMPARISON:  None recent FINDINGS: Right Kidney: Renal measurements: 10.1 x 4.2 x 4 cm = volume: 88 mL. There is significantly increased cortical echogenicity without evidence for hydronephrosis. There is a cyst arising from the upper pole. This cyst measures approximately 1.6 cm. Left Kidney: Renal measurements: 10.4 x 5.3 x 3.8 cm = volume: There is 110 mL. There is increased cortical echogenicity without evidence for hydronephrosis. There is a mixed cystic and solid mass measuring 2.4 x 3.2 x 2.9 cm in the  interpolar region of the left kidney. Bladder: Appears normal for degree of bladder distention. Other: None. IMPRESSION: 1. Echogenic kidneys bilaterally which can be seen in patients with medical renal disease. No hydronephrosis. 2. 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 Follow-up with a contrast-enhanced renal mass protocol MRI is recommended for further evaluation of this finding. Electronically Signed   By: Constance Holster M.D.   On: 06/18/2019 23:07    Labs:  CBC: Recent Labs    07/01/19 0615  WBC 17.9*  HGB 12.6*  HCT 37.4*  PLT 330    COAGS: Recent Labs    07/01/19 0615  INR 1.1    BMP: No results for input(s): NA, K, CL, CO2, GLUCOSE, BUN, CALCIUM, CREATININE, GFRNONAA, GFRAA in the last 8760 hours.  Invalid input(s): CMP  LIVER FUNCTION TESTS: No results for input(s): BILITOT, AST, ALT, ALKPHOS, PROT, ALBUMIN in the last 8760 hours.  TUMOR MARKERS: No results for input(s): AFPTM, CEA, CA199, CHROMGRNA in the last 8760 hours.  Assessment and Plan: 64 y.o. male with hx HTN, proteinuria and acute kidney injury on chronic kidney disease who presents today for image guided random renal biopsy for further evaluation.Risks and benefits of procedure was discussed with the patient  including, but not limited to bleeding, infection, damage to adjacent structures or low yield requiring additional tests.  All of the questions were answered and there is agreement to proceed.  Consent signed and in chart.     Thank you for this interesting consult.  I greatly enjoyed meeting [name] and look forward to participating in their care.  A copy of this report was sent to the requesting provider on this date.  Electronically Signed: D. Rowe Robert, PA-C 07/01/2019, 7:26 AM   I spent a total of 25 minutes    in face to face in clinical consultation, greater than 50% of which was counseling/coordinating care for  image guided random renal biopsy

## 2019-07-22 LAB — SURGICAL PATHOLOGY

## 2019-12-10 ENCOUNTER — Encounter (HOSPITAL_COMMUNITY): Payer: Self-pay

## 2020-01-03 ENCOUNTER — Other Ambulatory Visit (HOSPITAL_COMMUNITY): Payer: Self-pay | Admitting: *Deleted

## 2020-01-03 NOTE — Discharge Instructions (Signed)

## 2020-01-06 ENCOUNTER — Other Ambulatory Visit: Payer: Self-pay

## 2020-01-06 ENCOUNTER — Encounter (HOSPITAL_COMMUNITY)
Admission: RE | Admit: 2020-01-06 | Discharge: 2020-01-06 | Disposition: A | Discharge status: Home / Self Care | Payer: 59 | Source: Ambulatory Visit | Attending: Nephrology | Admitting: Nephrology

## 2020-01-06 DIAGNOSIS — D631 Anemia in chronic kidney disease: Secondary | ICD-10-CM | POA: Insufficient documentation

## 2020-01-06 DIAGNOSIS — N185 Chronic kidney disease, stage 5: Secondary | ICD-10-CM | POA: Diagnosis not present

## 2020-01-06 LAB — POCT HEMOGLOBIN-HEMACUE: Hemoglobin: 8.4 g/dL — ABNORMAL LOW (ref 13.0–17.0)

## 2020-01-06 MED ORDER — EPOETIN ALFA-EPBX 10000 UNIT/ML IJ SOLN
INTRAMUSCULAR | Status: AC
  Administered 2020-01-06: 20000 [IU] via SUBCUTANEOUS
  Filled 2020-01-06: qty 2

## 2020-01-06 MED ORDER — EPOETIN ALFA-EPBX 10000 UNIT/ML IJ SOLN: 20000.0000 [IU] | Freq: Once | INTRAMUSCULAR | Status: AC

## 2020-01-07 ENCOUNTER — Encounter (HOSPITAL_COMMUNITY): Payer: PRIVATE HEALTH INSURANCE

## 2020-01-17 ENCOUNTER — Other Ambulatory Visit (HOSPITAL_COMMUNITY): Payer: Self-pay | Admitting: *Deleted

## 2020-01-20 ENCOUNTER — Other Ambulatory Visit: Payer: Self-pay

## 2020-01-20 ENCOUNTER — Encounter (HOSPITAL_COMMUNITY)
Admission: RE | Admit: 2020-01-20 | Discharge: 2020-01-20 | Disposition: A | Discharge status: Home / Self Care | Payer: 59 | Source: Ambulatory Visit | Attending: Nephrology | Admitting: Nephrology

## 2020-01-20 DIAGNOSIS — D631 Anemia in chronic kidney disease: Secondary | ICD-10-CM | POA: Diagnosis not present

## 2020-01-20 DIAGNOSIS — N185 Chronic kidney disease, stage 5: Secondary | ICD-10-CM | POA: Insufficient documentation

## 2020-01-20 LAB — POCT HEMOGLOBIN-HEMACUE: Hemoglobin: 8.6 g/dL — ABNORMAL LOW (ref 13.0–17.0)

## 2020-01-20 MED ORDER — EPOETIN ALFA-EPBX 10000 UNIT/ML IJ SOLN
INTRAMUSCULAR | Status: AC
  Filled 2020-01-20: qty 2

## 2020-01-20 MED ORDER — EPOETIN ALFA-EPBX 10000 UNIT/ML IJ SOLN
20000.0000 [IU] | Freq: Once | INTRAMUSCULAR | Status: AC
  Administered 2020-01-20: 20000 [IU] via SUBCUTANEOUS

## 2020-01-31 ENCOUNTER — Other Ambulatory Visit: Payer: Self-pay

## 2020-01-31 DIAGNOSIS — N189 Chronic kidney disease, unspecified: Secondary | ICD-10-CM

## 2020-02-04 ENCOUNTER — Other Ambulatory Visit: Payer: Self-pay

## 2020-02-04 ENCOUNTER — Ambulatory Visit (HOSPITAL_COMMUNITY)
Admission: RE | Admit: 2020-02-04 | Discharge: 2020-02-04 | Disposition: A | Discharge status: Home / Self Care | Payer: 59 | Source: Ambulatory Visit | Attending: Nephrology | Admitting: Nephrology

## 2020-02-04 DIAGNOSIS — N185 Chronic kidney disease, stage 5: Secondary | ICD-10-CM | POA: Diagnosis present

## 2020-02-04 DIAGNOSIS — D631 Anemia in chronic kidney disease: Secondary | ICD-10-CM | POA: Insufficient documentation

## 2020-02-04 LAB — CBC WITH DIFFERENTIAL/PLATELET
Abs Immature Granulocytes: 0.03 10*3/uL (ref 0.00–0.07)
Basophils Absolute: 0 10*3/uL (ref 0.0–0.1)
Basophils Relative: 1 %
Eosinophils Absolute: 0.3 10*3/uL (ref 0.0–0.5)
Eosinophils Relative: 4 %
HCT: 28.3 % — ABNORMAL LOW (ref 39.0–52.0)
Hemoglobin: 9.1 g/dL — ABNORMAL LOW (ref 13.0–17.0)
Immature Granulocytes: 0 %
Lymphocytes Relative: 18 %
Lymphs Abs: 1.5 10*3/uL (ref 0.7–4.0)
MCH: 30.3 pg (ref 26.0–34.0)
MCHC: 32.2 g/dL (ref 30.0–36.0)
MCV: 94.3 fL (ref 80.0–100.0)
Monocytes Absolute: 0.6 10*3/uL (ref 0.1–1.0)
Monocytes Relative: 7 %
Neutro Abs: 6.1 10*3/uL (ref 1.7–7.7)
Neutrophils Relative %: 70 %
Platelets: 203 10*3/uL (ref 150–400)
RBC: 3 MIL/uL — ABNORMAL LOW (ref 4.22–5.81)
RDW: 15 % (ref 11.5–15.5)
WBC: 8.6 10*3/uL (ref 4.0–10.5)
nRBC: 0 % (ref 0.0–0.2)

## 2020-02-04 LAB — URINALYSIS, ROUTINE W REFLEX MICROSCOPIC
Bacteria, UA: NONE SEEN
Bilirubin Urine: NEGATIVE
Glucose, UA: 50 mg/dL — AB
Hgb urine dipstick: NEGATIVE
Ketones, ur: NEGATIVE mg/dL
Leukocytes,Ua: NEGATIVE
Nitrite: NEGATIVE
Protein, ur: 100 mg/dL — AB
Specific Gravity, Urine: 1.01 (ref 1.005–1.030)
pH: 5 (ref 5.0–8.0)

## 2020-02-04 LAB — COMPREHENSIVE METABOLIC PANEL
ALT: 10 U/L (ref 0–44)
AST: 7 U/L — ABNORMAL LOW (ref 15–41)
Albumin: 4 g/dL (ref 3.5–5.0)
Alkaline Phosphatase: 51 U/L (ref 38–126)
Anion gap: 15 (ref 5–15)
BUN: 138 mg/dL — ABNORMAL HIGH (ref 8–23)
CO2: 19 mmol/L — ABNORMAL LOW (ref 22–32)
Calcium: 9.5 mg/dL (ref 8.9–10.3)
Chloride: 107 mmol/L (ref 98–111)
Creatinine, Ser: 14.47 mg/dL — ABNORMAL HIGH (ref 0.61–1.24)
GFR calc Af Amer: 4 mL/min — ABNORMAL LOW (ref >60)
GFR calc non Af Amer: 3 mL/min — ABNORMAL LOW (ref >60)
Glucose, Bld: 130 mg/dL — ABNORMAL HIGH (ref 70–99)
Potassium: 4.8 mmol/L (ref 3.5–5.1)
Sodium: 141 mmol/L (ref 135–145)
Total Bilirubin: 0.5 mg/dL (ref 0.3–1.2)
Total Protein: 6.3 g/dL — ABNORMAL LOW (ref 6.5–8.1)

## 2020-02-04 LAB — PROTEIN / CREATININE RATIO, URINE
Creatinine, Urine: 105.02 mg/dL
Protein Creatinine Ratio: 1.06 mg/mg{Cre} — ABNORMAL HIGH (ref 0.00–0.15)
Total Protein, Urine: 111 mg/dL

## 2020-02-04 LAB — IRON AND TIBC
Iron: 68 ug/dL (ref 45–182)
Saturation Ratios: 25 % (ref 17.9–39.5)
TIBC: 273 ug/dL (ref 250–450)
UIBC: 205 ug/dL

## 2020-02-04 LAB — FERRITIN: Ferritin: 357 ng/mL — ABNORMAL HIGH (ref 24–336)

## 2020-02-04 LAB — PHOSPHORUS: Phosphorus: 7.4 mg/dL — ABNORMAL HIGH (ref 2.5–4.6)

## 2020-02-04 LAB — POCT HEMOGLOBIN-HEMACUE: Hemoglobin: 9.1 g/dL — ABNORMAL LOW (ref 13.0–17.0)

## 2020-02-04 MED ORDER — EPOETIN ALFA-EPBX 10000 UNIT/ML IJ SOLN
INTRAMUSCULAR | Status: AC
  Administered 2020-02-04: 20000 [IU] via SUBCUTANEOUS
  Filled 2020-02-04: qty 2

## 2020-02-04 MED ORDER — EPOETIN ALFA-EPBX 10000 UNIT/ML IJ SOLN: 20000.0000 [IU] | Freq: Once | INTRAMUSCULAR | Status: AC

## 2020-02-14 ENCOUNTER — Encounter: Payer: Self-pay | Admitting: Vascular Surgery

## 2020-02-14 ENCOUNTER — Encounter: Payer: Self-pay | Admitting: *Deleted

## 2020-02-14 ENCOUNTER — Other Ambulatory Visit: Payer: Self-pay | Admitting: *Deleted

## 2020-02-14 ENCOUNTER — Ambulatory Visit (INDEPENDENT_AMBULATORY_CARE_PROVIDER_SITE_OTHER): Payer: 59 | Admitting: Vascular Surgery

## 2020-02-14 ENCOUNTER — Ambulatory Visit (HOSPITAL_COMMUNITY)
Admission: RE | Admit: 2020-02-14 | Discharge: 2020-02-14 | Disposition: A | Discharge status: Home / Self Care | Payer: 59 | Source: Ambulatory Visit | Attending: Vascular Surgery | Admitting: Vascular Surgery

## 2020-02-14 ENCOUNTER — Other Ambulatory Visit: Payer: Self-pay

## 2020-02-14 ENCOUNTER — Ambulatory Visit (INDEPENDENT_AMBULATORY_CARE_PROVIDER_SITE_OTHER)
Admission: RE | Admit: 2020-02-14 | Discharge: 2020-02-14 | Disposition: A | Discharge status: Home / Self Care | Payer: 59 | Source: Ambulatory Visit | Attending: Vascular Surgery | Admitting: Vascular Surgery

## 2020-02-14 VITALS — BP 118/71 | HR 78 | Temp 97.8°F | Resp 20 | Ht 78.0 in | Wt 253.9 lb

## 2020-02-14 DIAGNOSIS — N189 Chronic kidney disease, unspecified: Secondary | ICD-10-CM | POA: Diagnosis not present

## 2020-02-14 NOTE — H&P (View-Only) (Signed)
Patient ID: [name], male   DOB: 02-11-1956, 64 y.o.   MRN: [id number]  Reason for Consult: New Patient (Initial Visit)   Referred by [name], MD  Subjective:     HPI:  [name] is a 64 y.o. male with ckd sent for evaluation dialysis access.  He has never been on dialysis before.  Risk factors include hyperlipidemia hypertension.  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  He has never had left upper extremity or chest or breast surgery.  Denies any history of pacemaker or port placement.  Past Medical History:  Diagnosis Date  . Chronic kidney disease   . Hyperlipidemia   . Hypertension    Family History  Problem Relation Age of Onset  . Heart disease Father    Past Surgical History:  Procedure Laterality Date  . CYST REMOVAL TRUNK      Short Social History:  Social History   Tobacco Use  . Smoking status: Never Smoker  . Smokeless tobacco: Never Used  Substance Use Topics  . Alcohol use: Never    No Known Allergies  Current Outpatient Medications  Medication Sig Dispense Refill  . allopurinol (ZYLOPRIM) 100 MG tablet Take 100 mg by mouth daily.    Marland Kitchen amLODipine (NORVASC) 5 MG tablet Take 5 mg by mouth daily.    Marland Kitchen esomeprazole (NEXIUM) 20 MG capsule Take 20 mg by mouth daily at 12 noon.    . furosemide (LASIX) 20 MG tablet Take 20 mg by mouth.    . olmesartan-hydrochlorothiazide (BENICAR HCT) 20-12.5 MG tablet Take 1 tablet by mouth daily.    . simvastatin (ZOCOR) 20 MG tablet Take 20 mg by mouth daily.    . predniSONE (DELTASONE) 20 MG tablet Take 20 mg by mouth daily with breakfast. (Patient not taking: Reported on 02/14/2020)     No current facility-administered medications for this visit.    Review of Systems  Constitutional:  Constitutional negative. HENT: HENT negative.  Eyes: Eyes negative.  Respiratory: Respiratory negative.  Cardiovascular: Cardiovascular negative.  GI: Gastrointestinal negative.  Musculoskeletal:  Musculoskeletal negative.  Skin: Skin negative.  Neurological: Neurological negative. Hematologic: Hematologic/lymphatic negative.  Psychiatric: Psychiatric negative.        Objective:  Objective   Vitals:   02/14/20 0957  BP: 118/71  Pulse: 78  Resp: 20  Temp: 97.8 F (36.6 C)  SpO2: 99%  Weight: 253 lb 14.4 oz (115.2 kg)  Height: 6\' 6"  (1.981 m)   Body mass index is 29.34 kg/m.  Physical Exam HENT:     Head: Normocephalic.     Nose:     Comments: Wearing a mask Eyes:     Pupils: Pupils are equal, round, and reactive to light.  Cardiovascular:     Rate and Rhythm: Normal rate.     Pulses: Normal pulses.  Pulmonary:     Effort: Pulmonary effort is normal.  Abdominal:     General: Abdomen is flat.     Palpations: Abdomen is soft.  Musculoskeletal:        General: Normal range of motion.     Cervical back: Normal range of motion and neck supple.  Skin:    General: Skin is warm and dry.     Capillary Refill: Capillary refill takes less than 2 seconds.  Neurological:     General: No focal deficit present.     Mental Status: He is alert.  Psychiatric:  Mood and Affect: Mood normal.        Behavior: Behavior normal.        Thought Content: Thought content normal.        Judgment: Judgment normal.     Data: I have independently interpreted his upper extremity vein mapping.  He has marginal right cephalic vein suitable right basilic vein for fistula creation above the antecubitum.  Left cephalic vein again is marginal but 0.37 cm at the antecubital fossa.  Left basilic vein also marginal for fistula creation.  I have independently interpreted his upper extremity arterial duplex which demonstrates right brachial artery 0.61 cm and triphasic and left 0.57 cm and triphasic.     Assessment/Plan:     64 year old right-hand-dominant man with chronic kidney disease.  We will plan for left upper extremity access which will possibly be cephalic vein versus  basilic vein fistula.  We discussed the need for possible further intervention including a basilic vein requiring 2 procedures.  We also discussed the risk of primary nonfunction, steal, nerve injury and he demonstrates good understanding and we will get him scheduled today.     Waynetta Sandy MD [facility]

## 2020-02-14 NOTE — Progress Notes (Signed)
Patient ID: [name], male   DOB: 03-03-1956, 64 y.o.   MRN: [id number]  Reason for Consult: New Patient (Initial Visit)   Referred by [name], MD  Subjective:     HPI:  [name] is a 64 y.o. male with ckd sent for evaluation dialysis access.  He has never been on dialysis before.  Risk factors include hyperlipidemia hypertension.  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  He has never had left upper extremity or chest or breast surgery.  Denies any history of pacemaker or port placement.  Past Medical History:  Diagnosis Date  . Chronic kidney disease   . Hyperlipidemia   . Hypertension    Family History  Problem Relation Age of Onset  . Heart disease Father    Past Surgical History:  Procedure Laterality Date  . CYST REMOVAL TRUNK      Short Social History:  Social History   Tobacco Use  . Smoking status: Never Smoker  . Smokeless tobacco: Never Used  Substance Use Topics  . Alcohol use: Never    No Known Allergies  Current Outpatient Medications  Medication Sig Dispense Refill  . allopurinol (ZYLOPRIM) 100 MG tablet Take 100 mg by mouth daily.    Marland Kitchen amLODipine (NORVASC) 5 MG tablet Take 5 mg by mouth daily.    Marland Kitchen esomeprazole (NEXIUM) 20 MG capsule Take 20 mg by mouth daily at 12 noon.    . furosemide (LASIX) 20 MG tablet Take 20 mg by mouth.    . olmesartan-hydrochlorothiazide (BENICAR HCT) 20-12.5 MG tablet Take 1 tablet by mouth daily.    . simvastatin (ZOCOR) 20 MG tablet Take 20 mg by mouth daily.    . predniSONE (DELTASONE) 20 MG tablet Take 20 mg by mouth daily with breakfast. (Patient not taking: Reported on 02/14/2020)     No current facility-administered medications for this visit.    Review of Systems  Constitutional:  Constitutional negative. HENT: HENT negative.  Eyes: Eyes negative.  Respiratory: Respiratory negative.  Cardiovascular: Cardiovascular negative.  GI: Gastrointestinal negative.  Musculoskeletal:  Musculoskeletal negative.  Skin: Skin negative.  Neurological: Neurological negative. Hematologic: Hematologic/lymphatic negative.  Psychiatric: Psychiatric negative.        Objective:  Objective   Vitals:   02/14/20 0957  BP: 118/71  Pulse: 78  Resp: 20  Temp: 97.8 F (36.6 C)  SpO2: 99%  Weight: 253 lb 14.4 oz (115.2 kg)  Height: 6\' 6"  (1.981 m)   Body mass index is 29.34 kg/m.  Physical Exam HENT:     Head: Normocephalic.     Nose:     Comments: Wearing a mask Eyes:     Pupils: Pupils are equal, round, and reactive to light.  Cardiovascular:     Rate and Rhythm: Normal rate.     Pulses: Normal pulses.  Pulmonary:     Effort: Pulmonary effort is normal.  Abdominal:     General: Abdomen is flat.     Palpations: Abdomen is soft.  Musculoskeletal:        General: Normal range of motion.     Cervical back: Normal range of motion and neck supple.  Skin:    General: Skin is warm and dry.     Capillary Refill: Capillary refill takes less than 2 seconds.  Neurological:     General: No focal deficit present.     Mental Status: He is alert.  Psychiatric:  Mood and Affect: Mood normal.        Behavior: Behavior normal.        Thought Content: Thought content normal.        Judgment: Judgment normal.     Data: I have independently interpreted his upper extremity vein mapping.  He has marginal right cephalic vein suitable right basilic vein for fistula creation above the antecubitum.  Left cephalic vein again is marginal but 0.37 cm at the antecubital fossa.  Left basilic vein also marginal for fistula creation.  I have independently interpreted his upper extremity arterial duplex which demonstrates right brachial artery 0.61 cm and triphasic and left 0.57 cm and triphasic.     Assessment/Plan:     64 year old right-hand-dominant man with chronic kidney disease.  We will plan for left upper extremity access which will possibly be cephalic vein versus  basilic vein fistula.  We discussed the need for possible further intervention including a basilic vein requiring 2 procedures.  We also discussed the risk of primary nonfunction, steal, nerve injury and he demonstrates good understanding and we will get him scheduled today.     Waynetta Sandy MD [facility]

## 2020-02-17 ENCOUNTER — Other Ambulatory Visit (HOSPITAL_COMMUNITY): Payer: 59

## 2020-02-18 ENCOUNTER — Encounter (HOSPITAL_COMMUNITY): Payer: 59

## 2020-02-21 ENCOUNTER — Encounter (HOSPITAL_COMMUNITY)
Admission: RE | Admit: 2020-02-21 | Discharge: 2020-02-21 | Disposition: A | Discharge status: Home / Self Care | Payer: 59 | Source: Ambulatory Visit | Attending: Nephrology | Admitting: Nephrology

## 2020-02-21 ENCOUNTER — Other Ambulatory Visit: Payer: Self-pay

## 2020-02-21 DIAGNOSIS — D631 Anemia in chronic kidney disease: Secondary | ICD-10-CM | POA: Diagnosis not present

## 2020-02-21 DIAGNOSIS — N185 Chronic kidney disease, stage 5: Secondary | ICD-10-CM | POA: Diagnosis not present

## 2020-02-21 LAB — POCT HEMOGLOBIN-HEMACUE: Hemoglobin: 9.3 g/dL — ABNORMAL LOW (ref 13.0–17.0)

## 2020-02-21 MED ORDER — EPOETIN ALFA-EPBX 10000 UNIT/ML IJ SOLN
20000.0000 [IU] | Freq: Once | INTRAMUSCULAR | Status: DC
  Administered 2020-02-21: 20000 [IU] via SUBCUTANEOUS

## 2020-02-21 MED ORDER — EPOETIN ALFA-EPBX 10000 UNIT/ML IJ SOLN
INTRAMUSCULAR | Status: AC
  Filled 2020-02-21: qty 2

## 2020-02-24 ENCOUNTER — Other Ambulatory Visit: Payer: Self-pay

## 2020-02-24 ENCOUNTER — Encounter (HOSPITAL_COMMUNITY): Payer: Self-pay | Admitting: Vascular Surgery

## 2020-02-24 ENCOUNTER — Telehealth: Payer: Self-pay

## 2020-02-24 NOTE — Telephone Encounter (Signed)
Attempted to reach pt back regarding questions for upcoming covid test on 02/26/20. Left message for pt to return call to office/surgery scheduling for any continued questions.

## 2020-02-24 NOTE — Telephone Encounter (Signed)
Patient returned call and verified covid testing site location of [place], [place]. Pt voiced understanding.

## 2020-02-24 NOTE — Progress Notes (Signed)
Spoke with pt for pre-op call. Pt denies cardiac history or Diabetes. Pt is treated for HTN.   Covid test scheduled for 02/26/20. Pt instructed to quarantine once the test is done and to continue quarantine until he comes to the hospital on Thursday. He voiced understanding.

## 2020-02-26 ENCOUNTER — Other Ambulatory Visit (HOSPITAL_COMMUNITY)
Admission: RE | Admit: 2020-02-26 | Discharge: 2020-02-26 | Disposition: A | Discharge status: Home / Self Care | Payer: 59 | Source: Ambulatory Visit | Attending: Vascular Surgery | Admitting: Vascular Surgery

## 2020-02-26 DIAGNOSIS — Z20822 Contact with and (suspected) exposure to covid-19: Secondary | ICD-10-CM | POA: Insufficient documentation

## 2020-02-26 LAB — SARS CORONAVIRUS 2 (TAT 6-24 HRS): SARS Coronavirus 2: NEGATIVE

## 2020-02-27 ENCOUNTER — Ambulatory Visit (HOSPITAL_COMMUNITY): Payer: 59 | Admitting: Certified Registered Nurse Anesthetist

## 2020-02-27 ENCOUNTER — Ambulatory Visit (HOSPITAL_COMMUNITY)
Admission: RE | Admit: 2020-02-27 | Discharge: 2020-02-27 | Disposition: A | Discharge status: Home / Self Care | Payer: 59 | Attending: Vascular Surgery | Admitting: Vascular Surgery

## 2020-02-27 ENCOUNTER — Encounter (HOSPITAL_COMMUNITY): Payer: Self-pay | Admitting: Vascular Surgery

## 2020-02-27 ENCOUNTER — Encounter (HOSPITAL_COMMUNITY)
Admission: RE | Disposition: A | Discharge status: Home / Self Care | Payer: Self-pay | Source: Home / Self Care | Attending: Vascular Surgery

## 2020-02-27 ENCOUNTER — Other Ambulatory Visit: Payer: Self-pay

## 2020-02-27 DIAGNOSIS — Z79899 Other long term (current) drug therapy: Secondary | ICD-10-CM | POA: Diagnosis not present

## 2020-02-27 DIAGNOSIS — I129 Hypertensive chronic kidney disease with stage 1 through stage 4 chronic kidney disease, or unspecified chronic kidney disease: Secondary | ICD-10-CM | POA: Insufficient documentation

## 2020-02-27 DIAGNOSIS — E785 Hyperlipidemia, unspecified: Secondary | ICD-10-CM | POA: Diagnosis not present

## 2020-02-27 DIAGNOSIS — N189 Chronic kidney disease, unspecified: Secondary | ICD-10-CM | POA: Diagnosis not present

## 2020-02-27 DIAGNOSIS — N185 Chronic kidney disease, stage 5: Secondary | ICD-10-CM | POA: Diagnosis not present

## 2020-02-27 DIAGNOSIS — N186 End stage renal disease: Secondary | ICD-10-CM

## 2020-02-27 DIAGNOSIS — Z992 Dependence on renal dialysis: Secondary | ICD-10-CM

## 2020-02-27 HISTORY — DX: Anemia, unspecified: D64.9

## 2020-02-27 HISTORY — DX: Gastro-esophageal reflux disease without esophagitis: K21.9

## 2020-02-27 HISTORY — PX: AV FISTULA PLACEMENT: SHX1204

## 2020-02-27 HISTORY — DX: Unspecified asthma, uncomplicated: J45.909

## 2020-02-27 LAB — POCT I-STAT, CHEM 8
BUN: 124 mg/dL — ABNORMAL HIGH (ref 8–23)
Calcium, Ion: 1.25 mmol/L (ref 1.15–1.40)
Chloride: 113 mmol/L — ABNORMAL HIGH (ref 98–111)
Creatinine, Ser: 14 mg/dL — ABNORMAL HIGH (ref 0.61–1.24)
Glucose, Bld: 100 mg/dL — ABNORMAL HIGH (ref 70–99)
HCT: 30 % — ABNORMAL LOW (ref 39.0–52.0)
Hemoglobin: 10.2 g/dL — ABNORMAL LOW (ref 13.0–17.0)
Potassium: 5.1 mmol/L (ref 3.5–5.1)
Sodium: 142 mmol/L (ref 135–145)
TCO2: 18 mmol/L — ABNORMAL LOW (ref 22–32)

## 2020-02-27 SURGERY — ARTERIOVENOUS (AV) FISTULA CREATION
Anesthesia: General | Site: Arm Upper | Laterality: Left

## 2020-02-27 MED ORDER — PHENYLEPHRINE 40 MCG/ML (10ML) SYRINGE FOR IV PUSH (FOR BLOOD PRESSURE SUPPORT)
PREFILLED_SYRINGE | INTRAVENOUS | Status: AC
  Filled 2020-02-27: qty 10

## 2020-02-27 MED ORDER — LIDOCAINE-EPINEPHRINE (PF) 1 %-1:200000 IJ SOLN
INTRAMUSCULAR | Status: DC | PRN
  Administered 2020-02-27: 5 mL

## 2020-02-27 MED ORDER — METOPROLOL SUCCINATE ER 25 MG PO TB24
ORAL_TABLET | ORAL | Status: AC
  Administered 2020-02-27: 25 mg via ORAL
  Filled 2020-02-27: qty 1

## 2020-02-27 MED ORDER — FENTANYL CITRATE (PF) 250 MCG/5ML IJ SOLN
INTRAMUSCULAR | Status: DC | PRN
Start: 2020-02-27 — End: 2020-02-27
  Administered 2020-02-27: 100 ug via INTRAVENOUS

## 2020-02-27 MED ORDER — FENTANYL CITRATE (PF) 250 MCG/5ML IJ SOLN
INTRAMUSCULAR | Status: AC
  Filled 2020-02-27: qty 5

## 2020-02-27 MED ORDER — CHLORHEXIDINE GLUCONATE 4 % EX LIQD: 60.0000 mL | Freq: Once | CUTANEOUS | Status: DC

## 2020-02-27 MED ORDER — MEPERIDINE HCL 25 MG/ML IJ SOLN: 6.2500 mg | INTRAMUSCULAR | Status: DC | PRN

## 2020-02-27 MED ORDER — SODIUM CHLORIDE 0.9 % IV SOLN
INTRAVENOUS | Status: DC
  Administered 2020-02-27: 10 mL/h via INTRAVENOUS

## 2020-02-27 MED ORDER — ONDANSETRON HCL 4 MG/2ML IJ SOLN
INTRAMUSCULAR | Status: AC
  Filled 2020-02-27: qty 2

## 2020-02-27 MED ORDER — SODIUM CHLORIDE 0.9 % IV SOLN
INTRAVENOUS | Status: DC | PRN
  Administered 2020-02-27: 500 mL

## 2020-02-27 MED ORDER — ACETAMINOPHEN 10 MG/ML IV SOLN
INTRAVENOUS | Status: AC
  Filled 2020-02-27: qty 100

## 2020-02-27 MED ORDER — LIDOCAINE-EPINEPHRINE (PF) 1 %-1:200000 IJ SOLN
INTRAMUSCULAR | Status: AC
  Filled 2020-02-27: qty 30

## 2020-02-27 MED ORDER — OXYCODONE-ACETAMINOPHEN 5-325 MG PO TABS: 1.0000 | ORAL_TABLET | ORAL | 0 refills | Status: AC | PRN

## 2020-02-27 MED ORDER — SUCCINYLCHOLINE CHLORIDE 200 MG/10ML IV SOSY
PREFILLED_SYRINGE | INTRAVENOUS | Status: AC
  Filled 2020-02-27: qty 10

## 2020-02-27 MED ORDER — ACETAMINOPHEN 325 MG PO TABS: 325.0000 mg | ORAL_TABLET | Freq: Once | ORAL | Status: DC | PRN

## 2020-02-27 MED ORDER — ONDANSETRON HCL 4 MG/2ML IJ SOLN: 4.0000 mg | INTRAMUSCULAR | Status: AC

## 2020-02-27 MED ORDER — LIDOCAINE 2% (20 MG/ML) 5 ML SYRINGE
INTRAMUSCULAR | Status: DC | PRN
  Administered 2020-02-27: 40 mg via INTRAVENOUS

## 2020-02-27 MED ORDER — SUCCINYLCHOLINE CHLORIDE 200 MG/10ML IV SOSY
PREFILLED_SYRINGE | INTRAVENOUS | Status: DC | PRN
  Administered 2020-02-27: 120 mg via INTRAVENOUS

## 2020-02-27 MED ORDER — ONDANSETRON HCL 4 MG/2ML IJ SOLN
INTRAMUSCULAR | Status: AC
  Administered 2020-02-27: 4 mg via INTRAVENOUS
  Filled 2020-02-27: qty 2

## 2020-02-27 MED ORDER — LACTATED RINGERS IV SOLN: INTRAVENOUS | Status: DC

## 2020-02-27 MED ORDER — ONDANSETRON HCL 4 MG/2ML IJ SOLN
INTRAMUSCULAR | Status: DC | PRN
  Administered 2020-02-27: 4 mg via INTRAVENOUS

## 2020-02-27 MED ORDER — SODIUM CHLORIDE 0.9 % IV SOLN
INTRAVENOUS | Status: AC
  Filled 2020-02-27: qty 1.2

## 2020-02-27 MED ORDER — 0.9 % SODIUM CHLORIDE (POUR BTL) OPTIME
TOPICAL | Status: DC | PRN
  Administered 2020-02-27: 1000 mL

## 2020-02-27 MED ORDER — CHLORHEXIDINE GLUCONATE 0.12 % MT SOLN: 15.0000 mL | Freq: Once | OROMUCOSAL | Status: AC

## 2020-02-27 MED ORDER — ACETAMINOPHEN 10 MG/ML IV SOLN: 1000.0000 mg | Freq: Once | INTRAVENOUS | Status: DC | PRN

## 2020-02-27 MED ORDER — MIDAZOLAM HCL 2 MG/2ML IJ SOLN
INTRAMUSCULAR | Status: DC | PRN
  Administered 2020-02-27: 2 mg via INTRAVENOUS

## 2020-02-27 MED ORDER — MIDAZOLAM HCL 2 MG/2ML IJ SOLN
INTRAMUSCULAR | Status: AC
  Filled 2020-02-27: qty 2

## 2020-02-27 MED ORDER — ACETAMINOPHEN 160 MG/5ML PO SOLN: 325.0000 mg | Freq: Once | ORAL | Status: DC | PRN

## 2020-02-27 MED ORDER — AMISULPRIDE (ANTIEMETIC) 5 MG/2ML IV SOLN: 10.0000 mg | Freq: Once | INTRAVENOUS | Status: DC | PRN

## 2020-02-27 MED ORDER — LIDOCAINE 2% (20 MG/ML) 5 ML SYRINGE
INTRAMUSCULAR | Status: AC
  Filled 2020-02-27: qty 5

## 2020-02-27 MED ORDER — PROPOFOL 10 MG/ML IV BOLUS
INTRAVENOUS | Status: DC | PRN
  Administered 2020-02-27: 150 mg via INTRAVENOUS
  Administered 2020-02-27: 50 mg via INTRAVENOUS

## 2020-02-27 MED ORDER — FENTANYL CITRATE (PF) 100 MCG/2ML IJ SOLN: 25.0000 ug | INTRAMUSCULAR | Status: DC | PRN

## 2020-02-27 MED ORDER — CEFAZOLIN SODIUM-DEXTROSE 2-4 GM/100ML-% IV SOLN
2.0000 g | INTRAVENOUS | Status: AC
  Administered 2020-02-27: 2 g via INTRAVENOUS
  Filled 2020-02-27: qty 100

## 2020-02-27 MED ORDER — DEXAMETHASONE SODIUM PHOSPHATE 10 MG/ML IJ SOLN
INTRAMUSCULAR | Status: DC | PRN
  Administered 2020-02-27: 5 mg via INTRAVENOUS

## 2020-02-27 MED ORDER — METOPROLOL SUCCINATE ER 25 MG PO TB24: 25.0000 mg | ORAL_TABLET | ORAL | Status: AC

## 2020-02-27 MED ORDER — OXYCODONE HCL 5 MG PO TABS
5.0000 mg | ORAL_TABLET | Freq: Once | ORAL | Status: AC
  Administered 2020-02-27: 5 mg via ORAL

## 2020-02-27 MED ORDER — CHLORHEXIDINE GLUCONATE 0.12 % MT SOLN
OROMUCOSAL | Status: AC
  Administered 2020-02-27: 15 mL
  Filled 2020-02-27: qty 15

## 2020-02-27 MED ORDER — OXYCODONE HCL 5 MG PO TABS
ORAL_TABLET | ORAL | Status: AC
  Filled 2020-02-27: qty 1

## 2020-02-27 MED ORDER — PROPOFOL 10 MG/ML IV BOLUS
INTRAVENOUS | Status: AC
  Filled 2020-02-27: qty 20

## 2020-02-27 MED ORDER — PHENYLEPHRINE 40 MCG/ML (10ML) SYRINGE FOR IV PUSH (FOR BLOOD PRESSURE SUPPORT)
PREFILLED_SYRINGE | INTRAVENOUS | Status: DC | PRN
  Administered 2020-02-27: 80 ug via INTRAVENOUS
  Administered 2020-02-27 (×2): 40 ug via INTRAVENOUS

## 2020-02-27 SURGICAL SUPPLY — 28 items
ARMBAND PINK RESTRICT EXTREMIT (MISCELLANEOUS) ×2 IMPLANT
CANISTER SUCT 3000ML PPV (MISCELLANEOUS) ×2 IMPLANT
CLIP VESOCCLUDE MED 6/CT (CLIP) ×2 IMPLANT
CLIP VESOCCLUDE SM WIDE 6/CT (CLIP) ×2 IMPLANT
COVER PROBE W GEL 5X96 (DRAPES) IMPLANT
COVER WAND RF STERILE (DRAPES) ×2 IMPLANT
DERMABOND ADVANCED (GAUZE/BANDAGES/DRESSINGS) ×1
DERMABOND ADVANCED .7 DNX12 (GAUZE/BANDAGES/DRESSINGS) ×1 IMPLANT
ELECT REM PT RETURN 9FT ADLT (ELECTROSURGICAL) ×2
ELECTRODE REM PT RTRN 9FT ADLT (ELECTROSURGICAL) ×1 IMPLANT
GLOVE BIO SURGEON STRL SZ7.5 (GLOVE) ×2 IMPLANT
GOWN STRL REUS W/ TWL LRG LVL3 (GOWN DISPOSABLE) ×2 IMPLANT
GOWN STRL REUS W/ TWL XL LVL3 (GOWN DISPOSABLE) ×1 IMPLANT
GOWN STRL REUS W/TWL LRG LVL3 (GOWN DISPOSABLE) ×4
GOWN STRL REUS W/TWL XL LVL3 (GOWN DISPOSABLE) ×2
INSERT FOGARTY SM (MISCELLANEOUS) IMPLANT
KIT BASIN OR (CUSTOM PROCEDURE TRAY) ×2 IMPLANT
KIT TURNOVER KIT B (KITS) ×2 IMPLANT
NS IRRIG 1000ML POUR BTL (IV SOLUTION) ×2 IMPLANT
PACK CV ACCESS (CUSTOM PROCEDURE TRAY) ×2 IMPLANT
PAD ARMBOARD 7.5X6 YLW CONV (MISCELLANEOUS) ×4 IMPLANT
SUT MNCRL AB 4-0 PS2 18 (SUTURE) ×2 IMPLANT
SUT PROLENE 6 0 BV (SUTURE) ×2 IMPLANT
SUT VIC AB 3-0 SH 27 (SUTURE) ×2
SUT VIC AB 3-0 SH 27X BRD (SUTURE) ×1 IMPLANT
TOWEL GREEN STERILE (TOWEL DISPOSABLE) ×2 IMPLANT
UNDERPAD 30X36 HEAVY ABSORB (UNDERPADS AND DIAPERS) ×2 IMPLANT
WATER STERILE IRR 1000ML POUR (IV SOLUTION) ×2 IMPLANT

## 2020-02-27 NOTE — Discharge Instructions (Signed)
[facility]  Discharge Instructions  AV Fistula or Graft Surgery for Dialysis Access  Please refer to the following instructions for your post-procedure care. Your surgeon or physician assistant will discuss any changes with you.  Activity  You may drive the day following your surgery, if you are comfortable and no longer taking prescription pain medication. Resume full activity as the soreness in your incision resolves.  Bathing/Showering  You may shower after you go home. Keep your incision dry for 48 hours. Do not soak in a bathtub, hot tub, or swim until the incision heals completely. You may not shower if you have a hemodialysis catheter.  Incision Care  Clean your incision with mild soap and water after 48 hours. Pat the area dry with a clean towel. You do not need a bandage unless otherwise instructed. Do not apply any ointments or creams to your incision. You may have skin glue on your incision. Do not peel it off. It will come off on its own in about one week. Your arm may swell a bit after surgery. To reduce swelling use pillows to elevate your arm so it is above your heart. Your doctor will tell you if you need to lightly wrap your arm with an ACE bandage.  Diet  Resume your normal diet. There are not special food restrictions following this procedure. In order to heal from your surgery, it is CRITICAL to get adequate nutrition. Your body requires vitamins, minerals, and protein. Vegetables are the best source of vitamins and minerals. Vegetables also provide the perfect balance of protein. Processed food has little nutritional value, so try to avoid this.  Medications  Resume taking all of your medications. If your incision is causing pain, you may take over-the counter pain relievers such as acetaminophen (Tylenol). If you were prescribed a stronger pain medication, please be aware these medications can cause nausea and constipation. Prevent  nausea by taking the medication with a snack or meal. Avoid constipation by drinking plenty of fluids and eating foods with high amount of fiber, such as fruits, vegetables, and grains.  Do not take Tylenol if you are taking prescription pain medications.  Follow up Your surgeon may want to see you in the office following your access surgery. If so, this will be arranged at the time of your surgery.  Please call us immediately for any of the following conditions:   Increased pain, redness, drainage (pus) from your incision site  Fever of 101 degrees or higher  Severe or worsening pain at your incision site  Hand pain or numbness.   Reduce your risk of vascular disease:   Stop smoking. If you would like help, call [facility] at 1-800-QUIT-NOW (781)220-8895) or [place] [facility] [place] your cholesterol  Maintain a desired weight  Control your diabetes  Keep your blood pressure down  Dialysis  It will take several weeks to several months for your new dialysis access to be ready for use. Your surgeon will determine when it is okay to use it. Your nephrologist will continue to direct your dialysis. You can continue to use your Permcath until your new access is ready for use.   02/27/2020 [name] [id number] [birth year]  Surgeon(s): Waynetta Sandy, MD  Procedure(s): ARTERIOVENOUS (AV) FISTULA CREATION LEFT   May stick graft immediately   May stick graft on designated area only:   X Do not stick left AV fistula for 12 weeks  If you have any questions, please call the office at [phone].

## 2020-02-27 NOTE — Anesthesia Preprocedure Evaluation (Addendum)
Anesthesia Evaluation  Patient identified by MRN, date of birth, ID band Patient awake    Reviewed: Allergy & Precautions, NPO status , Patient's Chart, lab work & pertinent test results  Airway Mallampati: IV  TM Distance: >3 FB Neck ROM: Full    Dental  (+) Teeth Intact, Dental Advisory Given   Pulmonary asthma ,    breath sounds clear to auscultation       Cardiovascular hypertension, Pt. on medications and Pt. on home beta blockers  Rhythm:Regular Rate:Normal     Neuro/Psych negative neurological ROS  negative psych ROS   GI/Hepatic Neg liver ROS, GERD  Medicated,  Endo/Other  negative endocrine ROS  Renal/GU CRFRenal disease     Musculoskeletal negative musculoskeletal ROS (+)   Abdominal   Peds  Hematology negative hematology ROS (+)   Anesthesia Other Findings   Reproductive/Obstetrics                            Anesthesia Physical Anesthesia Plan  ASA: IV  Anesthesia Plan: General   Post-op Pain Management:    Induction: Intravenous, Rapid sequence and Cricoid pressure planned  PONV Risk Score and Plan: 3 and Ondansetron, Dexamethasone and Midazolam  Airway Management Planned: Oral ETT  Additional Equipment: None  Intra-op Plan:   Post-operative Plan: Extubation in OR  Informed Consent: I have reviewed the patients History and Physical, chart, labs and discussed the procedure including the risks, benefits and alternatives for the proposed anesthesia with the patient or authorized representative who has indicated his/her understanding and acceptance.     Dental advisory given  Plan Discussed with: CRNA  Anesthesia Plan Comments: (Nauseated in preop. Will proceed with GA + RSI.  Lab Results      Component                Value               Date                      WBC                      8.6                 02/04/2020                HGB                       9.3 (L)             02/21/2020                HCT                      28.3 (L)            02/04/2020                MCV                      94.3                02/04/2020                PLT                      203  02/04/2020            Lab Results      Component                Value               Date                      CREATININE               14.47 (H)           02/04/2020                BUN                      138 (H)             02/04/2020                NA                       141                 02/04/2020                K                        4.8                 02/04/2020                CL                       107                 02/04/2020                CO2                      19 (L)              02/04/2020            EKG: normal sinus rhythm, RBBB. )       Anesthesia Quick Evaluation

## 2020-02-27 NOTE — Anesthesia Procedure Notes (Signed)
Procedure Name: Intubation Date/Time: 02/27/2020 7:37 AM Performed by: Michele Rockers, CRNA Pre-anesthesia Checklist: Patient identified, Emergency Drugs available, Suction available and Patient being monitored Patient Re-evaluated:Patient Re-evaluated prior to induction Oxygen Delivery Method: Circle system utilized Preoxygenation: Pre-oxygenation with 100% oxygen Induction Type: IV induction and Cricoid Pressure applied Laryngoscope Size: Mac, 3 and Glidescope Grade View: Grade I Tube type: Oral Tube size: 8.0 mm Number of attempts: 1 Airway Equipment and Method: Stylet and Oral airway Placement Confirmation: ETT inserted through vocal cords under direct vision,  positive ETCO2 and breath sounds checked- equal and bilateral Secured at: 22 cm Tube secured with: Tape Dental Injury: Teeth and Oropharynx as per pre-operative assessment

## 2020-02-27 NOTE — Transfer of Care (Signed)
Immediate Anesthesia Transfer of Care Note  Patient: [name]  Procedure(s) Performed: ARTERIOVENOUS (AV) FISTULA CREATION LEFT (Left Arm Upper)  Patient Location: PACU  Anesthesia Type:General  Level of Consciousness: awake, alert  and oriented  Airway & Oxygen Therapy: Patient Spontanous Breathing and Patient connected to face mask oxygen  Post-op Assessment: Report given to RN and Post -op Vital signs reviewed and stable  Post vital signs: Reviewed and stable  Last Vitals:  Vitals Value Taken Time  BP 146/78 02/27/20 0847  Temp    Pulse 84 02/27/20 0849  Resp 12 02/27/20 0849  SpO2 100 % 02/27/20 0849  Vitals shown include unvalidated device data.  Last Pain:  Vitals:   02/27/20 0847  TempSrc:   PainSc: (P) 2       Patients Stated Pain Goal: 5 (12/82/08 1388)  Complications: No complications documented.

## 2020-02-27 NOTE — Op Note (Signed)
    Patient name: [name] MRN: [id number] DOB: 1956-02-19 Sex: male  02/27/2020 Pre-operative Diagnosis: ckd Post-operative diagnosis:  Same Surgeon:  Erlene Quan C. Donzetta Matters, MD Assistant: Leory Plowman, MD Procedure Performed:  Left arm brachial artery to cephalic vein avf creation  Indications:  64yo with chronic kidney disease.  He is now indicated for permanent dialysis access.  Findings: Cephalic vein at the antecubital measured approximately 4 mm external diameter.  At completion there was a very strong thrill.  He also a palpable radial artery pulse at the wrist.   Procedure:  The patient was identified in the holding area and taken to the operating where is placed supine on the operative table and general anesthesia was induced.  He was gently prepped draped in the left upper extremity usual fashion antibiotics were administered timeout was called.  Ultrasound was used to identify a very suitable cephalic vein at the antecubitum.  Towards the wrist there was much more diminutive.  A transverse incision was made at the antecubitum.  We dissected out the vein this was very large.  We marked if orientation for we dissected through the deep fascia identified the brachial artery placed Vesseloops around this.  The vein was tied off distally and transected flushed heparinized saline and clamped.  The arteries clamped distally proximally opened longitudinally flushed with saline both directions.  We then sewed the vein inside with 6-0 Prolene suture.  Prior completion low flushing all directions.  Upon completion there was a very strong thrill in the fistula.  We did free up some soft tissue around this.  There was a palpable radial artery pulse the wrist.  These were both confirmed with Doppler.  We then irrigated the wound.  We injected 5 cc of local anesthetic.  We closed in layers of Vicryl Monocryl.  He was awake from anesthesia having tolerated procedure without any complication.  Counts were correct  at completion.   EBL: 20cc  Magdalen Cabana C. Donzetta Matters, MD [facility] Office: [phone] Pager: [phone]

## 2020-02-27 NOTE — Anesthesia Postprocedure Evaluation (Signed)
Anesthesia Post Note  Patient: [name]  Procedure(s) Performed: ARTERIOVENOUS (AV) FISTULA CREATION LEFT (Left Arm Upper)     Patient location during evaluation: PACU Anesthesia Type: General Level of consciousness: awake and alert Pain management: pain level controlled Vital Signs Assessment: post-procedure vital signs reviewed and stable Respiratory status: spontaneous breathing, nonlabored ventilation, respiratory function stable and patient connected to nasal cannula oxygen Cardiovascular status: blood pressure returned to baseline and stable Postop Assessment: no apparent nausea or vomiting Anesthetic complications: no   No complications documented.  Last Vitals:  Vitals:   02/27/20 0900 02/27/20 0915  BP: (!) 146/80 128/68  Pulse: 83 84  Resp: 15 17  Temp:  37.1 C  SpO2: 98% 98%    Last Pain:  Vitals:   02/27/20 0847  TempSrc:   PainSc: 2                  Effie Berkshire

## 2020-02-27 NOTE — Interval H&P Note (Signed)
History and Physical Interval Note:  02/27/2020 7:25 AM  [name]  has presented today for surgery, with the diagnosis of CKD.  The various methods of treatment have been discussed with the patient and family. After consideration of risks, benefits and other options for treatment, the patient has consented to  Procedure(s): ARTERIOVENOUS (AV) FISTULA CREATION LEFT (Left) as a surgical intervention.  The patient's history has been reviewed, patient examined, no change in status, stable for surgery.  I have reviewed the patient's chart and labs.  Questions were answered to the patient's satisfaction.     Servando Snare

## 2020-02-28 ENCOUNTER — Encounter (HOSPITAL_COMMUNITY): Payer: Self-pay | Admitting: Vascular Surgery

## 2020-03-06 ENCOUNTER — Encounter (HOSPITAL_COMMUNITY): Payer: 59

## 2020-03-09 ENCOUNTER — Encounter (HOSPITAL_COMMUNITY)
Admission: RE | Admit: 2020-03-09 | Discharge: 2020-03-09 | Disposition: A | Discharge status: Home / Self Care | Payer: 59 | Source: Ambulatory Visit | Attending: Nephrology | Admitting: Nephrology

## 2020-03-09 ENCOUNTER — Other Ambulatory Visit: Payer: Self-pay

## 2020-03-09 DIAGNOSIS — N185 Chronic kidney disease, stage 5: Secondary | ICD-10-CM | POA: Diagnosis not present

## 2020-03-09 DIAGNOSIS — D631 Anemia in chronic kidney disease: Secondary | ICD-10-CM | POA: Insufficient documentation

## 2020-03-09 LAB — COMPREHENSIVE METABOLIC PANEL
ALT: 9 U/L (ref 0–44)
AST: 11 U/L — ABNORMAL LOW (ref 15–41)
Albumin: 3.9 g/dL (ref 3.5–5.0)
Alkaline Phosphatase: 49 U/L (ref 38–126)
Anion gap: 14 (ref 5–15)
BUN: 129 mg/dL — ABNORMAL HIGH (ref 8–23)
CO2: 19 mmol/L — ABNORMAL LOW (ref 22–32)
Calcium: 9.4 mg/dL (ref 8.9–10.3)
Chloride: 111 mmol/L (ref 98–111)
Creatinine, Ser: 12.19 mg/dL — ABNORMAL HIGH (ref 0.61–1.24)
GFR, Estimated: 4 mL/min — ABNORMAL LOW (ref >60)
Glucose, Bld: 92 mg/dL (ref 70–99)
Potassium: 4.7 mmol/L (ref 3.5–5.1)
Sodium: 144 mmol/L (ref 135–145)
Total Bilirubin: 0.3 mg/dL (ref 0.3–1.2)
Total Protein: 6.4 g/dL — ABNORMAL LOW (ref 6.5–8.1)

## 2020-03-09 LAB — CBC WITH DIFFERENTIAL/PLATELET
Abs Immature Granulocytes: 0.02 10*3/uL (ref 0.00–0.07)
Basophils Absolute: 0 10*3/uL (ref 0.0–0.1)
Basophils Relative: 1 %
Eosinophils Absolute: 0.3 10*3/uL (ref 0.0–0.5)
Eosinophils Relative: 4 %
HCT: 28.6 % — ABNORMAL LOW (ref 39.0–52.0)
Hemoglobin: 9 g/dL — ABNORMAL LOW (ref 13.0–17.0)
Immature Granulocytes: 0 %
Lymphocytes Relative: 22 %
Lymphs Abs: 1.6 10*3/uL (ref 0.7–4.0)
MCH: 28.8 pg (ref 26.0–34.0)
MCHC: 31.5 g/dL (ref 30.0–36.0)
MCV: 91.4 fL (ref 80.0–100.0)
Monocytes Absolute: 0.7 10*3/uL (ref 0.1–1.0)
Monocytes Relative: 9 %
Neutro Abs: 4.7 10*3/uL (ref 1.7–7.7)
Neutrophils Relative %: 64 %
Platelets: 214 10*3/uL (ref 150–400)
RBC: 3.13 MIL/uL — ABNORMAL LOW (ref 4.22–5.81)
RDW: 15.3 % (ref 11.5–15.5)
WBC: 7.3 10*3/uL (ref 4.0–10.5)
nRBC: 0 % (ref 0.0–0.2)

## 2020-03-09 LAB — URINALYSIS, ROUTINE W REFLEX MICROSCOPIC
Bilirubin Urine: NEGATIVE
Glucose, UA: NEGATIVE mg/dL
Hgb urine dipstick: NEGATIVE
Ketones, ur: NEGATIVE mg/dL
Leukocytes,Ua: NEGATIVE
Nitrite: NEGATIVE
Protein, ur: 100 mg/dL — AB
Specific Gravity, Urine: 1.01 (ref 1.005–1.030)
pH: 5 (ref 5.0–8.0)

## 2020-03-09 LAB — POCT HEMOGLOBIN-HEMACUE: Hemoglobin: 9.4 g/dL — ABNORMAL LOW (ref 13.0–17.0)

## 2020-03-09 LAB — PHOSPHORUS: Phosphorus: 7.3 mg/dL — ABNORMAL HIGH (ref 2.5–4.6)

## 2020-03-09 LAB — PROTEIN / CREATININE RATIO, URINE
Creatinine, Urine: 117.66 mg/dL
Protein Creatinine Ratio: 0.93 mg/mg{Cre} — ABNORMAL HIGH (ref 0.00–0.15)
Total Protein, Urine: 110 mg/dL

## 2020-03-09 MED ORDER — EPOETIN ALFA-EPBX 10000 UNIT/ML IJ SOLN
INTRAMUSCULAR | Status: AC
  Administered 2020-03-09: 10000 [IU] via SUBCUTANEOUS
  Filled 2020-03-09: qty 2

## 2020-03-09 MED ORDER — EPOETIN ALFA-EPBX 10000 UNIT/ML IJ SOLN: 20000.0000 [IU] | Freq: Once | INTRAMUSCULAR | Status: DC

## 2020-03-20 ENCOUNTER — Other Ambulatory Visit (HOSPITAL_COMMUNITY): Payer: Self-pay | Admitting: *Deleted

## 2020-03-23 ENCOUNTER — Encounter (HOSPITAL_COMMUNITY)
Admission: RE | Admit: 2020-03-23 | Discharge: 2020-03-23 | Disposition: A | Discharge status: Home / Self Care | Payer: 59 | Source: Ambulatory Visit | Attending: Dermatology | Admitting: Dermatology

## 2020-03-23 ENCOUNTER — Other Ambulatory Visit: Payer: Self-pay

## 2020-03-23 DIAGNOSIS — N185 Chronic kidney disease, stage 5: Secondary | ICD-10-CM | POA: Diagnosis not present

## 2020-03-23 LAB — POCT HEMOGLOBIN-HEMACUE: Hemoglobin: 9.5 g/dL — ABNORMAL LOW (ref 13.0–17.0)

## 2020-03-23 MED ORDER — EPOETIN ALFA-EPBX 3000 UNIT/ML IJ SOLN
INTRAMUSCULAR | Status: AC
  Filled 2020-03-23: qty 1

## 2020-03-23 MED ORDER — EPOETIN ALFA-EPBX 2000 UNIT/ML IJ SOLN
INTRAMUSCULAR | Status: AC
  Filled 2020-03-23: qty 1

## 2020-03-23 MED ORDER — EPOETIN ALFA-EPBX 10000 UNIT/ML IJ SOLN
INTRAMUSCULAR | Status: AC
  Filled 2020-03-23: qty 1

## 2020-03-23 MED ORDER — EPOETIN ALFA-EPBX 10000 UNIT/ML IJ SOLN
20000.0000 [IU] | INTRAMUSCULAR | Status: DC
  Administered 2020-03-23: 20000 [IU] via SUBCUTANEOUS

## 2020-04-02 ENCOUNTER — Other Ambulatory Visit: Payer: Self-pay

## 2020-04-02 DIAGNOSIS — N189 Chronic kidney disease, unspecified: Secondary | ICD-10-CM

## 2020-04-06 ENCOUNTER — Other Ambulatory Visit: Payer: Self-pay

## 2020-04-06 ENCOUNTER — Ambulatory Visit (HOSPITAL_COMMUNITY)
Admission: RE | Admit: 2020-04-06 | Discharge: 2020-04-06 | Disposition: A | Discharge status: Home / Self Care | Payer: 59 | Source: Ambulatory Visit | Attending: Nephrology | Admitting: Nephrology

## 2020-04-06 DIAGNOSIS — D631 Anemia in chronic kidney disease: Secondary | ICD-10-CM | POA: Insufficient documentation

## 2020-04-06 DIAGNOSIS — N185 Chronic kidney disease, stage 5: Secondary | ICD-10-CM | POA: Insufficient documentation

## 2020-04-06 LAB — POCT HEMOGLOBIN-HEMACUE: Hemoglobin: 11.4 g/dL — ABNORMAL LOW (ref 13.0–17.0)

## 2020-04-06 LAB — CBC WITH DIFFERENTIAL/PLATELET
Abs Immature Granulocytes: 0.02 10*3/uL (ref 0.00–0.07)
Basophils Absolute: 0 10*3/uL (ref 0.0–0.1)
Basophils Relative: 0 %
Eosinophils Absolute: 0.4 10*3/uL (ref 0.0–0.5)
Eosinophils Relative: 5 %
HCT: 30.6 % — ABNORMAL LOW (ref 39.0–52.0)
Hemoglobin: 9.6 g/dL — ABNORMAL LOW (ref 13.0–17.0)
Immature Granulocytes: 0 %
Lymphocytes Relative: 19 %
Lymphs Abs: 1.4 10*3/uL (ref 0.7–4.0)
MCH: 28.6 pg (ref 26.0–34.0)
MCHC: 31.4 g/dL (ref 30.0–36.0)
MCV: 91.1 fL (ref 80.0–100.0)
Monocytes Absolute: 0.6 10*3/uL (ref 0.1–1.0)
Monocytes Relative: 9 %
Neutro Abs: 4.7 10*3/uL (ref 1.7–7.7)
Neutrophils Relative %: 67 %
Platelets: 195 10*3/uL (ref 150–400)
RBC: 3.36 MIL/uL — ABNORMAL LOW (ref 4.22–5.81)
RDW: 16.4 % — ABNORMAL HIGH (ref 11.5–15.5)
WBC: 7.1 10*3/uL (ref 4.0–10.5)
nRBC: 0 % (ref 0.0–0.2)

## 2020-04-06 LAB — COMPREHENSIVE METABOLIC PANEL
ALT: 10 U/L (ref 0–44)
AST: 9 U/L — ABNORMAL LOW (ref 15–41)
Albumin: 4 g/dL (ref 3.5–5.0)
Alkaline Phosphatase: 50 U/L (ref 38–126)
Anion gap: 14 (ref 5–15)
BUN: 139 mg/dL — ABNORMAL HIGH (ref 8–23)
CO2: 17 mmol/L — ABNORMAL LOW (ref 22–32)
Calcium: 9.2 mg/dL (ref 8.9–10.3)
Chloride: 110 mmol/L (ref 98–111)
Creatinine, Ser: 13.44 mg/dL — ABNORMAL HIGH (ref 0.61–1.24)
GFR, Estimated: 4 mL/min — ABNORMAL LOW (ref >60)
Glucose, Bld: 121 mg/dL — ABNORMAL HIGH (ref 70–99)
Potassium: 4.3 mmol/L (ref 3.5–5.1)
Sodium: 141 mmol/L (ref 135–145)
Total Bilirubin: 0.6 mg/dL (ref 0.3–1.2)
Total Protein: 6.3 g/dL — ABNORMAL LOW (ref 6.5–8.1)

## 2020-04-06 LAB — URINALYSIS, COMPLETE (UACMP) WITH MICROSCOPIC
Bacteria, UA: NONE SEEN
Bilirubin Urine: NEGATIVE
Glucose, UA: NEGATIVE mg/dL
Ketones, ur: NEGATIVE mg/dL
Leukocytes,Ua: NEGATIVE
Nitrite: NEGATIVE
Protein, ur: 100 mg/dL — AB
Specific Gravity, Urine: 1.01 (ref 1.005–1.030)
pH: 5 (ref 5.0–8.0)

## 2020-04-06 LAB — PROTEIN / CREATININE RATIO, URINE
Creatinine, Urine: 103.89 mg/dL
Protein Creatinine Ratio: 0.84 mg/mg{Cre} — ABNORMAL HIGH (ref 0.00–0.15)
Total Protein, Urine: 87 mg/dL

## 2020-04-06 LAB — PHOSPHORUS: Phosphorus: 7.7 mg/dL — ABNORMAL HIGH (ref 2.5–4.6)

## 2020-04-06 MED ORDER — EPOETIN ALFA-EPBX 10000 UNIT/ML IJ SOLN
INTRAMUSCULAR | Status: AC
  Filled 2020-04-06: qty 2

## 2020-04-06 MED ORDER — EPOETIN ALFA-EPBX 10000 UNIT/ML IJ SOLN
20000.0000 [IU] | INTRAMUSCULAR | Status: DC
  Administered 2020-04-06: 20000 [IU] via SUBCUTANEOUS

## 2020-04-07 LAB — PTH, INTACT AND CALCIUM
Calcium, Total (PTH): 9 mg/dL (ref 8.6–10.2)
PTH: 220 pg/mL — ABNORMAL HIGH (ref 15–65)

## 2020-04-10 ENCOUNTER — Ambulatory Visit (HOSPITAL_COMMUNITY)
Admission: RE | Admit: 2020-04-10 | Discharge: 2020-04-10 | Disposition: A | Discharge status: Home / Self Care | Payer: 59 | Source: Ambulatory Visit | Attending: Physician Assistant | Admitting: Physician Assistant

## 2020-04-10 ENCOUNTER — Other Ambulatory Visit: Payer: Self-pay

## 2020-04-10 ENCOUNTER — Ambulatory Visit (INDEPENDENT_AMBULATORY_CARE_PROVIDER_SITE_OTHER): Payer: Self-pay | Admitting: Physician Assistant

## 2020-04-10 VITALS — BP 126/66 | HR 80 | Temp 98.0°F | Resp 18 | Ht 78.0 in | Wt 230.0 lb

## 2020-04-10 DIAGNOSIS — N189 Chronic kidney disease, unspecified: Secondary | ICD-10-CM

## 2020-04-10 NOTE — Progress Notes (Signed)
POST OPERATIVE OFFICE NOTE    CC:  F/u for surgery  HPI:  This is a 64 y.o. male who is s/p Left arm brachial artery to cephalic vein avf creation   on 02/27/2020 by Dr. Donzetta Matters.    He had 2 main complaints about his time at [facility] hospital.  The bed was to short for him to fit on and requested that the hospital needs longer beds and that he developed a sore throat after surgery for a few days which did resolve on its own.    Pt returns today for follow up.  He denise pain, loss of sensation or loss of motor.  He is not currently requiring HD.  He is followed by Dr. [name].  No Known Allergies  Current Outpatient Medications  Medication Sig Dispense Refill  . acetaminophen (TYLENOL) 500 MG tablet Take 1,000 mg by mouth every 6 (six) hours as needed for moderate pain.    Marland Kitchen allopurinol (ZYLOPRIM) 100 MG tablet Take 100 mg by mouth daily.    Marland Kitchen amLODipine (NORVASC) 10 MG tablet Take 10 mg by mouth daily.     . calcium carbonate (TUMS - DOSED IN MG ELEMENTAL CALCIUM) 500 MG chewable tablet Chew 1 tablet by mouth daily as needed for indigestion or heartburn.    . Carboxymethylcellulose Sodium (THERATEARS OP) Place 1 drop into both eyes 2 (two) times daily as needed (dry eyes).    Marland Kitchen esomeprazole (NEXIUM) 20 MG capsule Take 20 mg by mouth daily as needed (acid reflux).     . furosemide (LASIX) 20 MG tablet Take 20 mg by mouth daily.     . metoprolol succinate (TOPROL-XL) 50 MG 24 hr tablet Take 25 mg by mouth daily. Take with or immediately following a meal.    . Multiple Vitamin (MULTIVITAMIN WITH MINERALS) TABS tablet Take 1 tablet by mouth daily.    . simvastatin (ZOCOR) 20 MG tablet Take 20 mg by mouth daily.    Marland Kitchen oxyCODONE-acetaminophen (PERCOCET) 5-325 MG tablet Take 1 tablet by mouth every 4 (four) hours as needed for severe pain. (Patient not taking: Reported on 04/10/2020) 6 tablet 0   No current facility-administered medications for this visit.     ROS:  See HPI  Physical Exam:    Findings:  +--------------------+----------+-----------------+--------+  AVF         PSV (cm/s)Flow Vol (mL/min)Comments  +--------------------+----------+-----------------+--------+  Native artery inflow  263     1788          +--------------------+----------+-----------------+--------+  AVF Anastomosis     760                 +--------------------+----------+-----------------+--------+     +------------+----------+-------------+----------+--------+  OUTFLOW VEINPSV (cm/s)Diameter (cm)Depth (cm)Describe  +------------+----------+-------------+----------+--------+  Shoulder    310    0.93     1.25        +------------+----------+-------------+----------+--------+  Prox UA     232    0.74     0.53        +------------+----------+-------------+----------+--------+  Mid UA     232    0.69     0.51        +------------+----------+-------------+----------+--------+  Dist UA     221    0.72     0.47        +------------+----------+-------------+----------+--------+  AC Fossa    280    0.91     0.35        +------------+----------+-------------+----------+--------+     Summary:  Patent arteriovenous fistula.  Incision:  Well healed AC incision Extremities:  Palpable radial pulse, grip 5/5, sensation intact and palpable thrill throughout the fistula  Lungs: non labored breathing Heart :  RRR  Assessment/Plan:  This is a 64 y.o. male who is s/p:Left arm brachial artery to cephalic vein avf creation   The fistula has matured well and has a good thrill.  He has no signs of steal.  The fistula may be accessed as of 05/28/20.    Roxy Horseman PA-C [facility] [phone]  Clinic MD:  Donzetta Matters

## 2020-04-20 ENCOUNTER — Other Ambulatory Visit: Payer: Self-pay

## 2020-04-20 ENCOUNTER — Encounter (HOSPITAL_COMMUNITY)
Admission: RE | Admit: 2020-04-20 | Discharge: 2020-04-20 | Disposition: A | Discharge status: Home / Self Care | Payer: 59 | Source: Ambulatory Visit | Attending: Nephrology | Admitting: Nephrology

## 2020-04-20 DIAGNOSIS — D631 Anemia in chronic kidney disease: Secondary | ICD-10-CM | POA: Insufficient documentation

## 2020-04-20 DIAGNOSIS — N185 Chronic kidney disease, stage 5: Secondary | ICD-10-CM | POA: Diagnosis present

## 2020-04-20 LAB — POCT HEMOGLOBIN-HEMACUE: Hemoglobin: 9.8 g/dL — ABNORMAL LOW (ref 13.0–17.0)

## 2020-04-20 MED ORDER — EPOETIN ALFA-EPBX 10000 UNIT/ML IJ SOLN: 20000.0000 [IU] | INTRAMUSCULAR | Status: DC

## 2020-04-20 MED ORDER — EPOETIN ALFA-EPBX 10000 UNIT/ML IJ SOLN
INTRAMUSCULAR | Status: AC
  Administered 2020-04-20: 20000 [IU] via SUBCUTANEOUS
  Filled 2020-04-20: qty 2

## 2020-05-01 ENCOUNTER — Other Ambulatory Visit (HOSPITAL_COMMUNITY): Payer: Self-pay | Admitting: *Deleted

## 2020-05-04 ENCOUNTER — Other Ambulatory Visit: Payer: Self-pay

## 2020-05-04 ENCOUNTER — Encounter (HOSPITAL_COMMUNITY)
Admission: RE | Admit: 2020-05-04 | Discharge: 2020-05-04 | Disposition: A | Discharge status: Home / Self Care | Payer: 59 | Source: Ambulatory Visit | Attending: Nephrology | Admitting: Nephrology

## 2020-05-04 DIAGNOSIS — N185 Chronic kidney disease, stage 5: Secondary | ICD-10-CM | POA: Insufficient documentation

## 2020-05-04 DIAGNOSIS — D631 Anemia in chronic kidney disease: Secondary | ICD-10-CM | POA: Insufficient documentation

## 2020-05-04 LAB — COMPREHENSIVE METABOLIC PANEL
ALT: 9 U/L (ref 0–44)
AST: 10 U/L — ABNORMAL LOW (ref 15–41)
Albumin: 3.8 g/dL (ref 3.5–5.0)
Alkaline Phosphatase: 58 U/L (ref 38–126)
Anion gap: 12 (ref 5–15)
BUN: 114 mg/dL — ABNORMAL HIGH (ref 8–23)
CO2: 20 mmol/L — ABNORMAL LOW (ref 22–32)
Calcium: 8.9 mg/dL (ref 8.9–10.3)
Chloride: 108 mmol/L (ref 98–111)
Creatinine, Ser: 12.95 mg/dL — ABNORMAL HIGH (ref 0.61–1.24)
GFR, Estimated: 4 mL/min — ABNORMAL LOW (ref >60)
Glucose, Bld: 89 mg/dL (ref 70–99)
Potassium: 4.8 mmol/L (ref 3.5–5.1)
Sodium: 140 mmol/L (ref 135–145)
Total Bilirubin: 0.7 mg/dL (ref 0.3–1.2)
Total Protein: 6.2 g/dL — ABNORMAL LOW (ref 6.5–8.1)

## 2020-05-04 LAB — URINALYSIS, COMPLETE (UACMP) WITH MICROSCOPIC
Bacteria, UA: NONE SEEN
Bilirubin Urine: NEGATIVE
Glucose, UA: NEGATIVE mg/dL
Ketones, ur: NEGATIVE mg/dL
Leukocytes,Ua: NEGATIVE
Nitrite: NEGATIVE
Protein, ur: 100 mg/dL — AB
Specific Gravity, Urine: 1.01 (ref 1.005–1.030)
pH: 5 (ref 5.0–8.0)

## 2020-05-04 LAB — CBC WITH DIFFERENTIAL/PLATELET
Abs Immature Granulocytes: 0.02 10*3/uL (ref 0.00–0.07)
Basophils Absolute: 0 10*3/uL (ref 0.0–0.1)
Basophils Relative: 1 %
Eosinophils Absolute: 0.3 10*3/uL (ref 0.0–0.5)
Eosinophils Relative: 5 %
HCT: 29.4 % — ABNORMAL LOW (ref 39.0–52.0)
Hemoglobin: 9.6 g/dL — ABNORMAL LOW (ref 13.0–17.0)
Immature Granulocytes: 0 %
Lymphocytes Relative: 21 %
Lymphs Abs: 1.2 10*3/uL (ref 0.7–4.0)
MCH: 29.9 pg (ref 26.0–34.0)
MCHC: 32.7 g/dL (ref 30.0–36.0)
MCV: 91.6 fL (ref 80.0–100.0)
Monocytes Absolute: 0.7 10*3/uL (ref 0.1–1.0)
Monocytes Relative: 13 %
Neutro Abs: 3.4 10*3/uL (ref 1.7–7.7)
Neutrophils Relative %: 60 %
Platelets: 169 10*3/uL (ref 150–400)
RBC: 3.21 MIL/uL — ABNORMAL LOW (ref 4.22–5.81)
RDW: 17.1 % — ABNORMAL HIGH (ref 11.5–15.5)
WBC: 5.7 10*3/uL (ref 4.0–10.5)
nRBC: 0 % (ref 0.0–0.2)

## 2020-05-04 LAB — IRON AND TIBC
Iron: 44 ug/dL — ABNORMAL LOW (ref 45–182)
Saturation Ratios: 17 % — ABNORMAL LOW (ref 17.9–39.5)
TIBC: 262 ug/dL (ref 250–450)
UIBC: 218 ug/dL

## 2020-05-04 LAB — PHOSPHORUS: Phosphorus: 6.8 mg/dL — ABNORMAL HIGH (ref 2.5–4.6)

## 2020-05-04 LAB — PROTEIN / CREATININE RATIO, URINE
Creatinine, Urine: 85.16 mg/dL
Protein Creatinine Ratio: 0.97 mg/mg{Cre} — ABNORMAL HIGH (ref 0.00–0.15)
Total Protein, Urine: 83 mg/dL

## 2020-05-04 LAB — POCT HEMOGLOBIN-HEMACUE: Hemoglobin: 9.6 g/dL — ABNORMAL LOW (ref 13.0–17.0)

## 2020-05-04 LAB — FERRITIN: Ferritin: 162 ng/mL (ref 24–336)

## 2020-05-04 MED ORDER — EPOETIN ALFA-EPBX 10000 UNIT/ML IJ SOLN
INTRAMUSCULAR | Status: AC
  Administered 2020-05-04: 20000 [IU] via SUBCUTANEOUS
  Filled 2020-05-04: qty 2

## 2020-05-04 MED ORDER — EPOETIN ALFA-EPBX 10000 UNIT/ML IJ SOLN: 20000.0000 [IU] | Freq: Once | INTRAMUSCULAR | Status: AC

## 2020-05-15 ENCOUNTER — Other Ambulatory Visit (HOSPITAL_COMMUNITY): Payer: Self-pay | Admitting: *Deleted

## 2020-05-18 ENCOUNTER — Encounter (HOSPITAL_COMMUNITY): Payer: 59

## 2020-05-18 ENCOUNTER — Other Ambulatory Visit: Payer: Self-pay

## 2020-05-18 ENCOUNTER — Encounter (HOSPITAL_COMMUNITY)
Admission: RE | Admit: 2020-05-18 | Discharge: 2020-05-18 | Disposition: A | Discharge status: Home / Self Care | Payer: 59 | Source: Ambulatory Visit | Attending: Nephrology | Admitting: Nephrology

## 2020-05-18 DIAGNOSIS — N185 Chronic kidney disease, stage 5: Secondary | ICD-10-CM | POA: Diagnosis not present

## 2020-05-18 LAB — POCT HEMOGLOBIN-HEMACUE: Hemoglobin: 9.6 g/dL — ABNORMAL LOW (ref 13.0–17.0)

## 2020-05-18 MED ORDER — EPOETIN ALFA-EPBX 10000 UNIT/ML IJ SOLN
20000.0000 [IU] | Freq: Once | INTRAMUSCULAR | Status: AC
  Administered 2020-05-18: 20000 [IU] via SUBCUTANEOUS

## 2020-05-18 MED ORDER — EPOETIN ALFA-EPBX 10000 UNIT/ML IJ SOLN
INTRAMUSCULAR | Status: AC
  Filled 2020-05-18: qty 2

## 2020-06-01 ENCOUNTER — Encounter (HOSPITAL_COMMUNITY): Payer: 59

## 2020-07-01 ENCOUNTER — Other Ambulatory Visit (HOSPITAL_COMMUNITY): Payer: Self-pay | Admitting: *Deleted

## 2020-07-02 ENCOUNTER — Ambulatory Visit (HOSPITAL_COMMUNITY)
Admission: RE | Admit: 2020-07-02 | Discharge: 2020-07-02 | Disposition: A | Discharge status: Home / Self Care | Payer: 59 | Source: Ambulatory Visit | Attending: Nephrology | Admitting: Nephrology

## 2020-07-02 ENCOUNTER — Other Ambulatory Visit: Payer: Self-pay

## 2020-07-02 DIAGNOSIS — N185 Chronic kidney disease, stage 5: Secondary | ICD-10-CM | POA: Diagnosis present

## 2020-07-02 DIAGNOSIS — D631 Anemia in chronic kidney disease: Secondary | ICD-10-CM | POA: Diagnosis present

## 2020-07-02 LAB — URINALYSIS, COMPLETE (UACMP) WITH MICROSCOPIC
Bilirubin Urine: NEGATIVE
Glucose, UA: NEGATIVE mg/dL
Ketones, ur: NEGATIVE mg/dL
Leukocytes,Ua: NEGATIVE
Nitrite: NEGATIVE
Protein, ur: 100 mg/dL — AB
Specific Gravity, Urine: 1.011 (ref 1.005–1.030)
pH: 5 (ref 5.0–8.0)

## 2020-07-02 LAB — CBC WITH DIFFERENTIAL/PLATELET
Abs Immature Granulocytes: 0.02 10*3/uL (ref 0.00–0.07)
Basophils Absolute: 0 10*3/uL (ref 0.0–0.1)
Basophils Relative: 1 %
Eosinophils Absolute: 0.4 10*3/uL (ref 0.0–0.5)
Eosinophils Relative: 5 %
HCT: 30 % — ABNORMAL LOW (ref 39.0–52.0)
Hemoglobin: 9.3 g/dL — ABNORMAL LOW (ref 13.0–17.0)
Immature Granulocytes: 0 %
Lymphocytes Relative: 18 %
Lymphs Abs: 1.5 10*3/uL (ref 0.7–4.0)
MCH: 28.4 pg (ref 26.0–34.0)
MCHC: 31 g/dL (ref 30.0–36.0)
MCV: 91.5 fL (ref 80.0–100.0)
Monocytes Absolute: 0.6 10*3/uL (ref 0.1–1.0)
Monocytes Relative: 7 %
Neutro Abs: 5.7 10*3/uL (ref 1.7–7.7)
Neutrophils Relative %: 69 %
Platelets: 154 10*3/uL (ref 150–400)
RBC: 3.28 MIL/uL — ABNORMAL LOW (ref 4.22–5.81)
RDW: 16.8 % — ABNORMAL HIGH (ref 11.5–15.5)
WBC: 8.2 10*3/uL (ref 4.0–10.5)
nRBC: 0 % (ref 0.0–0.2)

## 2020-07-02 LAB — COMPREHENSIVE METABOLIC PANEL
ALT: 11 U/L (ref 0–44)
AST: 10 U/L — ABNORMAL LOW (ref 15–41)
Albumin: 4.2 g/dL (ref 3.5–5.0)
Alkaline Phosphatase: 75 U/L (ref 38–126)
Anion gap: 16 — ABNORMAL HIGH (ref 5–15)
BUN: 132 mg/dL — ABNORMAL HIGH (ref 8–23)
CO2: 18 mmol/L — ABNORMAL LOW (ref 22–32)
Calcium: 10 mg/dL (ref 8.9–10.3)
Chloride: 110 mmol/L (ref 98–111)
Creatinine, Ser: 13.08 mg/dL — ABNORMAL HIGH (ref 0.61–1.24)
GFR, Estimated: 4 mL/min — ABNORMAL LOW (ref >60)
Glucose, Bld: 90 mg/dL (ref 70–99)
Potassium: 4.5 mmol/L (ref 3.5–5.1)
Sodium: 144 mmol/L (ref 135–145)
Total Bilirubin: 0.3 mg/dL (ref 0.3–1.2)
Total Protein: 6.7 g/dL (ref 6.5–8.1)

## 2020-07-02 LAB — PROTEIN / CREATININE RATIO, URINE
Creatinine, Urine: 105.11 mg/dL
Protein Creatinine Ratio: 0.82 mg/mg{Cre} — ABNORMAL HIGH (ref 0.00–0.15)
Total Protein, Urine: 86 mg/dL

## 2020-07-02 LAB — IRON AND TIBC
Iron: 95 ug/dL (ref 45–182)
Saturation Ratios: 34 % (ref 17.9–39.5)
TIBC: 280 ug/dL (ref 250–450)
UIBC: 185 ug/dL

## 2020-07-02 LAB — POCT HEMOGLOBIN-HEMACUE: Hemoglobin: 9.2 g/dL — ABNORMAL LOW (ref 13.0–17.0)

## 2020-07-02 LAB — FERRITIN: Ferritin: 329 ng/mL (ref 24–336)

## 2020-07-02 LAB — PHOSPHORUS: Phosphorus: 6.4 mg/dL — ABNORMAL HIGH (ref 2.5–4.6)

## 2020-07-02 MED ORDER — EPOETIN ALFA-EPBX 40000 UNIT/ML IJ SOLN
40000.0000 [IU] | Freq: Once | INTRAMUSCULAR | Status: AC
Start: 2020-07-02 — End: 2020-07-02

## 2020-07-02 MED ORDER — EPOETIN ALFA-EPBX 40000 UNIT/ML IJ SOLN
INTRAMUSCULAR | Status: AC
  Administered 2020-07-02: 40000 [IU] via SUBCUTANEOUS
  Filled 2020-07-02: qty 1

## 2020-07-03 LAB — PTH, INTACT AND CALCIUM
Calcium, Total (PTH): 9.6 mg/dL (ref 8.6–10.2)
PTH: 241 pg/mL — ABNORMAL HIGH (ref 15–65)

## 2020-07-30 ENCOUNTER — Encounter (HOSPITAL_COMMUNITY): Payer: 59

## 2020-07-31 ENCOUNTER — Encounter (HOSPITAL_COMMUNITY): Payer: 59
